# Patient Record
Sex: Male | Born: 1980 | Hispanic: No | Marital: Married | Smoking: Former smoker
Health system: Southern US, Community
[De-identification: ages and names within clinical notes are randomized; demographics above are authoritative.]

## PROBLEM LIST (undated history)

## (undated) DIAGNOSIS — U071 COVID-19: Secondary | ICD-10-CM

## (undated) DIAGNOSIS — K760 Fatty (change of) liver, not elsewhere classified: Secondary | ICD-10-CM

## (undated) DIAGNOSIS — B019 Varicella without complication: Secondary | ICD-10-CM

## (undated) DIAGNOSIS — R569 Unspecified convulsions: Secondary | ICD-10-CM

## (undated) DIAGNOSIS — M109 Gout, unspecified: Secondary | ICD-10-CM

## (undated) HISTORY — DX: Varicella without complication: B01.9

## (undated) HISTORY — DX: Gout, unspecified: M10.9

## (undated) HISTORY — DX: COVID-19: U07.1

## (undated) HISTORY — DX: Fatty (change of) liver, not elsewhere classified: K76.0

## (undated) HISTORY — DX: Unspecified convulsions: R56.9

## (undated) HISTORY — PX: FRACTURE SURGERY: SHX138

---

## 2016-06-12 ENCOUNTER — Ambulatory Visit (INDEPENDENT_AMBULATORY_CARE_PROVIDER_SITE_OTHER): Payer: 59 | Admitting: Family Medicine

## 2016-06-12 ENCOUNTER — Encounter: Payer: Self-pay | Admitting: Family Medicine

## 2016-06-12 VITALS — BP 132/92 | HR 102 | Temp 98.2°F | Ht 71.5 in | Wt 221.0 lb

## 2016-06-12 DIAGNOSIS — Z130 Encounter for screening for diseases of the blood and blood-forming organs and certain disorders involving the immune mechanism: Secondary | ICD-10-CM

## 2016-06-12 DIAGNOSIS — E669 Obesity, unspecified: Secondary | ICD-10-CM | POA: Diagnosis not present

## 2016-06-12 DIAGNOSIS — Z0000 Encounter for general adult medical examination without abnormal findings: Secondary | ICD-10-CM | POA: Diagnosis not present

## 2016-06-12 DIAGNOSIS — E66811 Obesity, class 1: Secondary | ICD-10-CM

## 2016-06-12 NOTE — Progress Notes (Signed)
Subjective:  Patient ID: James Andrews, male    DOB: [birth year]  Age: 35 y.o. MRN: [id number]  CC: Physical/establisih care.   HPI James Andrews is a 35 y.o. male presents to the clinic today for an annual physical.  Preventative Healthcare  Immunizations  Tetanus - 2013.  Flu - Declines.  Labs: Screening labs today.  Exercise: No regular exercise.  Alcohol use: No.  Smoking/tobacco use: Nonsmoker. Use smokeless tobacco.  PMH, Surgical Hx, Family Hx, Social History reviewed and updated as below.  Past Medical History:  Diagnosis Date  . Chicken pox   . Seizures ([place])    Absence seizure as a child.   Past Surgical History:  Procedure Laterality Date  . FRACTURE SURGERY     Hand fracture    Family History  Problem Relation Age of Onset  . Testicular cancer Brother   . Arthritis Maternal Grandmother   . Breast cancer Maternal Grandmother   . Arthritis Paternal Grandfather   . Diabetes Paternal Grandfather    Social History  Substance Use Topics  . Smoking status: Never Smoker  . Smokeless tobacco: Current User    Types: Chew  . Alcohol use No   Review of Systems General: Denies unexplained weight loss, fever. Skin: Denies new or changing mole, sore/wound that won't heal. ENT: Trouble hearing, ringing in the ears, sores in the mouth, hoarseness, trouble swallowing. Eyes: Denies trouble seeing/visual disturbance. Heart/CV: Denies chest pain, shortness of breath, edema, palpitations. Lungs/Resp: Denies cough, shortness of breath, hemoptysis. Abd/GI: Denies nausea, vomiting, diarrhea, constipation, abdominal pain, hematochezia, melena. GU: Denies dysuria, incontinence, hematuria, urinary frequency, difficulty starting/keeping stream, penile discharge, sexual difficulty, lump in testicles. MSK: Denies joint pain/swelling, myalgias. Neuro: Denies headaches, weakness, numbness, dizziness, syncope. Psych: Denies sadness, anxiety, stress, memory difficulty. Endocrine:  Denies polyuria and polydipsia.  Objective:   Today's Vitals: BP (!) 132/92 (BP Location: Right Arm, Patient Position: Sitting, Cuff Size: Large)   Pulse (!) 102   Temp 98.2 F (36.8 C) (Oral)   Ht 5' 11.5" (1.816 m)   Wt 221 lb (100.2 kg)   SpO2 96%   BMI 30.39 kg/m   Physical Exam  Constitutional: He is oriented to person, place, and time. He appears well-developed and well-nourished. No distress.  HENT:  Head: Normocephalic and atraumatic.  Nose: Nose normal.  Mouth/Throat: Oropharynx is clear and moist. No oropharyngeal exudate.  Normal TM's bilaterally.   Eyes: Conjunctivae are normal. No scleral icterus.  Neck: Neck supple. No thyromegaly present.  Cardiovascular: Normal rate and regular rhythm.   No murmur heard. Pulmonary/Chest: Effort normal and breath sounds normal. He has no wheezes. He has no rales.  Abdominal: Soft. He exhibits no distension. There is no tenderness. There is no rebound and no guarding. Hernia confirmed negative in the right inguinal area and confirmed negative in the left inguinal area.  Genitourinary: Testes normal and penis normal.  Musculoskeletal: Normal range of motion. He exhibits no edema.  Lymphadenopathy:    He has no cervical adenopathy.  Neurological: He is alert and oriented to person, place, and time.  Skin: Skin is warm and dry. No rash noted.  Psychiatric: He has a normal mood and affect.  Vitals reviewed.   Assessment & Plan:   Problem List Items Addressed This Visit    Obesity (BMI 30.0-34.9)   Relevant Orders   Comp Met (CMET)   Lipid Profile   HgB A1c   Annual physical exam - Primary    Tdap up to  date. Declines flu. Screening labs today.        Other Visit Diagnoses    Screening for deficiency anemia       Relevant Orders   CBC     Follow-up: Annually.  [place]

## 2016-06-12 NOTE — Patient Instructions (Signed)
Follow up annually.  Take care  Dr Lacinda Axon    Health Maintenance, Male A healthy lifestyle and preventative care can promote health and wellness.  Maintain regular health, dental, and eye exams.  Eat a healthy diet. Foods like vegetables, fruits, whole grains, low-fat dairy products, and lean protein foods contain the nutrients you need and are low in calories. Decrease your intake of foods high in solid fats, added sugars, and salt. Get information about a proper diet from your health care provider, if necessary.  Regular physical exercise is one of the most important things you can do for your health. Most adults should get at least 150 minutes of moderate-intensity exercise (any activity that increases your heart rate and causes you to sweat) each week. In addition, most adults need muscle-strengthening exercises on 2 or more days a week.   Maintain a healthy weight. The body mass index (BMI) is a screening tool to identify possible weight problems. It provides an estimate of body fat based on height and weight. Your health care provider can find your BMI and can help you achieve or maintain a healthy weight. For males 20 years and older:  A BMI below 18.5 is considered underweight.  A BMI of 18.5 to 24.9 is normal.  A BMI of 25 to 29.9 is considered overweight.  A BMI of 30 and above is considered obese.  Maintain normal blood lipids and cholesterol by exercising and minimizing your intake of saturated fat. Eat a balanced diet with plenty of fruits and vegetables. Blood tests for lipids and cholesterol should begin at age 16 and be repeated every 5 years. If your lipid or cholesterol levels are high, you are over age 63, or you are at high risk for heart disease, you may need your cholesterol levels checked more frequently.Ongoing high lipid and cholesterol levels should be treated with medicines if diet and exercise are not working.  If you smoke, find out from your health care  provider how to quit. If you do not use tobacco, do not start.  Lung cancer screening is recommended for adults aged 28-80 years who are at high risk for developing lung cancer because of a history of smoking. A yearly low-dose CT scan of the lungs is recommended for people who have at least a 30-pack-year history of smoking and are current smokers or have quit within the past 15 years. A pack year of smoking is smoking an average of 1 pack of cigarettes a day for 1 year (for example, a 30-pack-year history of smoking could mean smoking 1 pack a day for 30 years or 2 packs a day for 15 years). Yearly screening should continue until the smoker has stopped smoking for at least 15 years. Yearly screening should be stopped for people who develop a health problem that would prevent them from having lung cancer treatment.  If you choose to drink alcohol, do not have more than 2 drinks per day. One drink is considered to be 12 oz (360 mL) of beer, 5 oz (150 mL) of wine, or 1.5 oz (45 mL) of liquor.  Avoid the use of street drugs. Do not share needles with anyone. Ask for help if you need support or instructions about stopping the use of drugs.  High blood pressure causes heart disease and increases the risk of stroke. High blood pressure is more likely to develop in:  People who have blood pressure in the end of the normal range (100-139/85-89 mm Hg).  People  who are overweight or obese.  People who are African American.  If you are 46-77 years of age, have your blood pressure checked every 3-5 years. If you are 3 years of age or older, have your blood pressure checked every year. You should have your blood pressure measured twice--once when you are at a hospital or clinic, and once when you are not at a hospital or clinic. Record the average of the two measurements. To check your blood pressure when you are not at a hospital or clinic, you can use:  An automated blood pressure machine at a  pharmacy.  A home blood pressure monitor.  If you are 55-61 years old, ask your health care provider if you should take aspirin to prevent heart disease.  Diabetes screening involves taking a blood sample to check your fasting blood sugar level. This should be done once every 3 years after age 27 if you are at a normal weight and without risk factors for diabetes. Testing should be considered at a younger age or be carried out more frequently if you are overweight and have at least 1 risk factor for diabetes.  Colorectal cancer can be detected and often prevented. Most routine colorectal cancer screening begins at the age of 49 and continues through age 70. However, your health care provider may recommend screening at an earlier age if you have risk factors for colon cancer. On a yearly basis, your health care provider may provide home test kits to check for hidden blood in the stool. A small camera at the end of a tube may be used to directly examine the colon (sigmoidoscopy or colonoscopy) to detect the earliest forms of colorectal cancer. Talk to your health care provider about this at age 67 when routine screening begins. A direct exam of the colon should be repeated every 5-10 years through age 40, unless early forms of precancerous polyps or small growths are found.  People who are at an increased risk for hepatitis B should be screened for this virus. You are considered at high risk for hepatitis B if:  You were born in a country where hepatitis B occurs often. Talk with your health care provider about which countries are considered high risk.  Your parents were born in a high-risk country and you have not received a shot to protect against hepatitis B (hepatitis B vaccine).  You have HIV or AIDS.  You use needles to inject street drugs.  You live with, or have sex with, someone who has hepatitis B.  You are a man who has sex with other men (MSM).  You get hemodialysis  treatment.  You take certain medicines for conditions like cancer, organ transplantation, and autoimmune conditions.  Hepatitis C blood testing is recommended for all people born from [birth year] through 1966 and any individual with known risk factors for hepatitis C.  Healthy men should no longer receive prostate-specific antigen (PSA) blood tests as part of routine cancer screening. Talk to your health care provider about prostate cancer screening.  Testicular cancer screening is not recommended for adolescents or adult males who have no symptoms. Screening includes self-exam, a health care provider exam, and other screening tests. Consult with your health care provider about any symptoms you have or any concerns you have about testicular cancer.  Practice safe sex. Use condoms and avoid high-risk sexual practices to reduce the spread of sexually transmitted infections (STIs).  You should be screened for STIs, including gonorrhea and chlamydia if:  You are sexually active and are younger than 24 years.  You are older than 24 years, and your health care provider tells you that you are at risk for this type of infection.  Your sexual activity has changed since you were last screened, and you are at an increased risk for chlamydia or gonorrhea. Ask your health care provider if you are at risk.  If you are at risk of being infected with HIV, it is recommended that you take a prescription medicine daily to prevent HIV infection. This is called pre-exposure prophylaxis (PrEP). You are considered at risk if:  You are a man who has sex with other men (MSM).  You are a heterosexual man who is sexually active with multiple partners.  You take drugs by injection.  You are sexually active with a partner who has HIV.  Talk with your health care provider about whether you are at high risk of being infected with HIV. If you choose to begin PrEP, you should first be tested for HIV. You should then be tested  every 3 months for as long as you are taking PrEP.  Use sunscreen. Apply sunscreen liberally and repeatedly throughout the day. You should seek shade when your shadow is shorter than you. Protect yourself by wearing long sleeves, pants, a wide-brimmed hat, and sunglasses year round whenever you are outdoors.  Tell your health care provider of new moles or changes in moles, especially if there is a change in shape or color. Also, tell your health care provider if a mole is larger than the size of a pencil eraser.  A one-time screening for abdominal aortic aneurysm (AAA) and surgical repair of large AAAs by ultrasound is recommended for men aged 44-75 years who are current or former smokers.  Stay current with your vaccines (immunizations).   This information is not intended to replace advice given to you by your health care provider. Make sure you discuss any questions you have with your health care provider.   Document Released: 03/13/2008 Document Revised: 10/06/2014 Document Reviewed: 02/10/2011 Elsevier Interactive Patient Education [employer].

## 2016-06-12 NOTE — Progress Notes (Signed)
Pre visit review using our clinic review tool, if applicable. No additional management support is needed unless otherwise documented below in the visit note. 

## 2016-06-12 NOTE — Assessment & Plan Note (Signed)
Tdap up to date. Declines flu. Screening labs today.

## 2016-06-13 LAB — COMPREHENSIVE METABOLIC PANEL
ALK PHOS: 80 U/L (ref 39–117)
ALT: 40 U/L (ref 0–53)
AST: 24 U/L (ref 0–37)
Albumin: 4.3 g/dL (ref 3.5–5.2)
BILIRUBIN TOTAL: 0.6 mg/dL (ref 0.2–1.2)
BUN: 15 mg/dL (ref 6–23)
CO2: 31 meq/L (ref 19–32)
Calcium: 9.2 mg/dL (ref 8.4–10.5)
Chloride: 103 mEq/L (ref 96–112)
Creatinine, Ser: 1.15 mg/dL (ref 0.40–1.50)
GFR: 76.54 mL/min (ref >60.00)
GLUCOSE: 86 mg/dL (ref 70–99)
Potassium: 3.8 mEq/L (ref 3.5–5.1)
SODIUM: 140 meq/L (ref 135–145)
TOTAL PROTEIN: 7.6 g/dL (ref 6.0–8.3)

## 2016-06-13 LAB — HEMOGLOBIN A1C: Hgb A1c MFr Bld: 5.9 % (ref 4.6–6.5)

## 2016-06-13 LAB — LIPID PANEL
CHOL/HDL RATIO: 4
Cholesterol: 178 mg/dL (ref 0–200)
HDL: 44 mg/dL (ref >39.00)
LDL Cholesterol: 120 mg/dL — ABNORMAL HIGH (ref 0–99)
NonHDL: 133.66
Triglycerides: 69 mg/dL (ref 0.0–149.0)
VLDL: 13.8 mg/dL (ref 0.0–40.0)

## 2016-06-13 LAB — CBC
HCT: 43.3 % (ref 39.0–52.0)
HEMOGLOBIN: 14.5 g/dL (ref 13.0–17.0)
MCHC: 33.5 g/dL (ref 30.0–36.0)
MCV: 88.2 fl (ref 78.0–100.0)
Platelets: 200 10*3/uL (ref 150.0–400.0)
RBC: 4.9 Mil/uL (ref 4.22–5.81)
RDW: 14.9 % (ref 11.5–15.5)
WBC: 7.2 10*3/uL (ref 4.0–10.5)

## 2016-12-22 DIAGNOSIS — M79675 Pain in left toe(s): Secondary | ICD-10-CM | POA: Diagnosis not present

## 2016-12-22 DIAGNOSIS — L02412 Cutaneous abscess of left axilla: Secondary | ICD-10-CM | POA: Diagnosis not present

## 2017-04-16 ENCOUNTER — Encounter: Payer: Self-pay | Admitting: Family Medicine

## 2017-04-16 ENCOUNTER — Ambulatory Visit (INDEPENDENT_AMBULATORY_CARE_PROVIDER_SITE_OTHER): Payer: 59 | Admitting: Family Medicine

## 2017-04-16 DIAGNOSIS — M549 Dorsalgia, unspecified: Secondary | ICD-10-CM | POA: Insufficient documentation

## 2017-04-16 DIAGNOSIS — L918 Other hypertrophic disorders of the skin: Secondary | ICD-10-CM | POA: Insufficient documentation

## 2017-04-16 DIAGNOSIS — M545 Low back pain, unspecified: Secondary | ICD-10-CM

## 2017-04-16 DIAGNOSIS — R0981 Nasal congestion: Secondary | ICD-10-CM | POA: Diagnosis not present

## 2017-04-16 DIAGNOSIS — L729 Follicular cyst of the skin and subcutaneous tissue, unspecified: Secondary | ICD-10-CM | POA: Diagnosis not present

## 2017-04-16 MED ORDER — MUPIROCIN 2 % EX OINT: TOPICAL_OINTMENT | CUTANEOUS | 0 refills | Status: DC

## 2017-04-16 MED ORDER — INDOMETHACIN 50 MG PO CAPS: ORAL_CAPSULE | ORAL | 0 refills | Status: DC

## 2017-04-16 MED ORDER — BACLOFEN 10 MG PO TABS: 10.0000 mg | ORAL_TABLET | Freq: Three times a day (TID) | ORAL | 0 refills | Status: DC

## 2017-04-16 MED ORDER — FLUTICASONE PROPIONATE 50 MCG/ACT NA SUSP: 2.0000 | Freq: Every day | NASAL | 2 refills | Status: DC

## 2017-04-16 NOTE — Assessment & Plan Note (Addendum)
New problem. Removed today. See procedure.  Procedure: Skin tag removal Skin tags in the left and right axilla (4) were located. Areas cleaned with betadine. Anesthesia: Lidocaine 1% with epinephrine (2 mL total). Hemostat used to clamp the base of the skin tag. Skin tags subsequent removed with sterile scissors. Minimal bleeding; controlled with cautery.

## 2017-04-16 NOTE — Progress Notes (Signed)
Subjective:  Patient ID: Charleston Hankin, male    DOB: 07-Oct-1980  Age: 36 y.o. MRN: [id number]  CC: Cyst, Skin tags, Back pain, Sinus issues  HPI:  36 year old male presents with the above complaints.  Skin cyst  Has been present for years. Location: Posterior shoulder (left).  Has been slowly enlarging.  Bothersome at times. No current redness or tenderness to suggest infection.  He has had this removed previously but it recurred.  He is interested in removal today.  No reports of drainage from the cyst. Currently nontender.  Skin tags  Patient reports he has had several skin tags in his axilla (bilateral) for quite some time.  They are starting to bother him as they get irritated and become bothersome while bathing in the shower.  No reports of erythema. No tenderness.  He wants them removed.  He has one in the left axilla and 3 in the right axilla.  Back pain  Patient reports he has intermittent back pain.  He feels that it's muscular in origin.  He states that his low back tightens up.  He states that his wife has some baclofen at home. He is interested in having a muscle relaxer as he think it would help.  Moderate in severity at times.  Likely from heavy lifting at work.  No other associated symptoms.  Sinus issues  Patient has had a history of sinusitis.  He states that intranasal steroids have helped stable off future bouts of sinusitis.  He states that he was on Flonase previously. Next line he has none at this time.  He would like a prescription.   Social Hx   Social History   Social History  . Marital status: Married    Spouse name: N/A  . Number of children: N/A  . Years of education: N/A   Social History Main Topics  . Smoking status: Never Smoker  . Smokeless tobacco: Current User    Types: Chew  . Alcohol use No  . Drug use: No  . Sexual activity: Yes    Partners: Female   Other Topics Concern  . None   Social History  Narrative  . None    Review of Systems  HENT: Positive for congestion.   Musculoskeletal: Positive for back pain.  Skin:       Cyst, skin tags.   Objective:  BP 118/86   Pulse 90   Temp 97.7 F (36.5 C) (Oral)   Wt 226 lb (102.5 kg)   SpO2 96%   BMI 31.08 kg/m   BP/Weight 04/16/2017 0/93/2671  Systolic BP 245 809  Diastolic BP 86 92  Wt. (Lbs) 226 221  BMI 31.08 30.39    Physical Exam  Constitutional: He is oriented to person, place, and time. He appears well-developed. No distress.  HENT:  Head: Normocephalic and atraumatic.  Pulmonary/Chest: Effort normal. No respiratory distress.  Musculoskeletal:  Back - Normal ROM.  Neurological: He is alert and oriented to person, place, and time.  Skin:  Left axilla - one small skin tag noted. Right axilla - 3 small skin tags noted. Left posterior shoulder - 3 cm circumferential nodule noted. Nontender to palpation. No surrounding erythema. No fluctuance.  Psychiatric: He has a normal mood and affect.  Vitals reviewed.   Lab Results  Component Value Date   WBC 7.2 06/12/2016   HGB 14.5 06/12/2016   HCT 43.3 06/12/2016   PLT 200.0 06/12/2016   GLUCOSE 86 06/12/2016   CHOL 178 06/12/2016  TRIG 69.0 06/12/2016   HDL 44.00 06/12/2016   LDLCALC 120 (H) 06/12/2016   ALT 40 06/12/2016   AST 24 06/12/2016   NA 140 06/12/2016   K 3.8 06/12/2016   CL 103 06/12/2016   CREATININE 1.15 06/12/2016   BUN 15 06/12/2016   CO2 31 06/12/2016   HGBA1C 5.9 06/12/2016    Assessment & Plan:   Problem List Items Addressed This Visit      Respiratory   Sinus congestion    Rx for Flonase sent.        Musculoskeletal and Integument   Benign skin cyst    New problem. Removed today without difficulty. See procedure.  Procedure: Cyst removal  Area prepped and draped in sterile fashion.   Local anesthesia with 1% lidocaine with epi (3 mL).  #11 scalpel was used to make a linear incision.  Blunt dissection and pressure  was used to remove cyst contents and sac.  Wound was closed with 3 simple interrupted sutures.  Patient tolerated the procedure well. No complications. Minimal blood loss.       Skin tags, multiple acquired    New problem. Removed today. See procedure.  Procedure: Skin tag removal Skin tags in the left and right axilla (4) were located. Areas cleaned with betadine. Anesthesia: Lidocaine 1% with epinephrine (2 mL total). Hemostat used to clamp the base of the skin tag. Skin tags subsequent removed with sterile scissors. Minimal bleeding; controlled with cautery.         Other   Back pain    New problem. PRN Baclofen.      Relevant Medications   indomethacin (INDOCIN) 50 MG capsule   baclofen (LIORESAL) 10 MG tablet      Meds ordered this encounter  Medications  . DISCONTD: indomethacin (INDOCIN) 50 MG capsule    Sig: TAKE 1 CAP 3 TIMES DAILY WITH FOOD. MAY STOP WHEN PAIN RESOLVED    Refill:  1  . indomethacin (INDOCIN) 50 MG capsule    Sig: TAKE 1 CAP 3 TIMES DAILY WITH FOOD. MAY STOP WHEN PAIN RESOLVED    Dispense:  90 capsule    Refill:  0  . baclofen (LIORESAL) 10 MG tablet    Sig: Take 1 tablet (10 mg total) by mouth 3 (three) times daily.    Dispense:  30 each    Refill:  0  . fluticasone (FLONASE) 50 MCG/ACT nasal spray    Sig: Place 2 sprays into both nostrils daily.    Dispense:  16 g    Refill:  2  . mupirocin ointment (BACTROBAN) 2 %    Sig: Apply topical to affected area daily.    Dispense:  22 g    Refill:  0   Follow-up: No Follow-up on file.  [place]

## 2017-04-16 NOTE — Assessment & Plan Note (Signed)
New problem. PRN Baclofen.

## 2017-04-16 NOTE — Assessment & Plan Note (Signed)
New problem. Removed today without difficulty. See procedure.  Procedure: Cyst removal  Area prepped and draped in sterile fashion.   Local anesthesia with 1% lidocaine with epi (3 mL).  #11 scalpel was used to make a linear incision.  Blunt dissection and pressure was used to remove cyst contents and sac.  Wound was closed with 3 simple interrupted sutures.  Patient tolerated the procedure well. No complications. Minimal blood loss.

## 2017-04-16 NOTE — Patient Instructions (Signed)
Follow up in 7 days for suture removal.  Take care  Dr. Lacinda Andrews    Sutured Wound Care Sutures are stitches that can be used to close wounds. Taking care of your wound properly can help to prevent pain and infection. It can also help your wound to heal more quickly. How is this treated? Wound Care  Keep the wound clean and dry.  If you were given a bandage (dressing), you should change it at least once per day or as directed by your health care provider. You should also change it if it becomes wet or dirty.  Keep the wound completely dry for the first 24 hours or as directed by your health care provider. After that time, you may shower or bathe. However, make sure that the wound is not soaked in water until the sutures have been removed.  Clean the wound one time each day or as directed by your health care provider. ? Wash the wound with soap and water. ? Rinse the wound with water to remove all soap. ? Pat the wound dry with a clean towel. Do not rub the wound.  Aftercleaning the wound, apply a thin layer of antibioticointment as directed by your health care provider. This will help to prevent infection and keep the dressing from sticking to the wound.  Have the sutures removed as directed by your health care provider. General Instructions  Take or apply medicines only as directed by your health care provider.  To help prevent scarring, make sure to cover your wound with sunscreen whenever you are outside after the sutures are removed and the wound is healed. Make sure to wear a sunscreen of at least 30 SPF.  If you were prescribed an antibiotic medicine or ointment, finish all of it even if you start to feel better.  Do not scratch or pick at the wound.  Keep all follow-up visits as directed by your health care provider. This is important.  Check your wound every day for signs of infection. Watch for: ? Redness, swelling, or pain. ? Fluid, blood, or pus.  Raise (elevate) the  injured area above the level of your heart while you are sitting or lying down, if possible.  Avoid stretching your wound.  Drink enough fluids to keep your urine clear or pale yellow. Contact a health care provider if:  You received a tetanus shot and you have swelling, severe pain, redness, or bleeding at the injection site.  You have a fever.  A wound that was closed breaks open.  You notice a bad smell coming from the wound.  You notice something coming out of the wound, such as wood or glass.  Your pain is not controlled with medicine.  You have increased redness, swelling, or pain at the site of your wound.  You have fluid, blood, or pus coming from your wound.  You notice a change in the color of your skin near your wound.  You need to change the dressing frequently due to fluid, blood, or pus draining from the wound.  You develop a new rash.  You develop numbness around the wound. Get help right away if:  You develop severe swelling around the injury site.  Your pain suddenly increases and is severe.  You develop painful lumps near the wound or on skin that is anywhere on your body.  You have a red streak going away from your wound.  The wound is on your hand or foot and you cannot properly  move a finger or toe.  The wound is on your hand or foot and you notice that your fingers or toes look pale or bluish. This information is not intended to replace advice given to you by your health care provider. Make sure you discuss any questions you have with your health care provider. Document Released: 10/23/2004 Document Revised: 02/21/2016 Document Reviewed: 04/27/2013 Elsevier Interactive Patient Education  2017 [employer].

## 2017-04-16 NOTE — Assessment & Plan Note (Signed)
Rx for Flonase sent.

## 2017-04-24 ENCOUNTER — Ambulatory Visit (INDEPENDENT_AMBULATORY_CARE_PROVIDER_SITE_OTHER): Payer: 59 | Admitting: Family Medicine

## 2017-04-24 ENCOUNTER — Encounter: Payer: Self-pay | Admitting: Family Medicine

## 2017-04-24 DIAGNOSIS — Z4802 Encounter for removal of sutures: Secondary | ICD-10-CM

## 2017-04-24 NOTE — Assessment & Plan Note (Signed)
Sutures removed today and standard fashion. Wound is well-healed.

## 2017-04-24 NOTE — Progress Notes (Signed)
   Subjective:  Patient ID: James Andrews, male    DOB: [birth year]  Age: 36 y.o. MRN: [id number]  CC: Suture removal  HPI:  36 year old male presents for suture removal.  Patient was recently seen on 7/19 and had a skin cyst removed. 3 simple interrupted sutures were placed. He states that the wound is healing well. No drainage from the wound. No erythema. He has no complaints or concerns at this time.  Social Hx   Social History   Social History  . Marital status: Married    Spouse name: N/A  . Number of children: N/A  . Years of education: N/A   Social History Main Topics  . Smoking status: Never Smoker  . Smokeless tobacco: Current User    Types: Chew  . Alcohol use No  . Drug use: No  . Sexual activity: Yes    Partners: Female   Other Topics Concern  . None   Social History Narrative  . None    Review of Systems  Constitutional: Negative.   Skin:       No erythema or drainage.   Objective:  BP 120/80 (BP Location: Left Arm, Patient Position: Sitting, Cuff Size: Large)   Pulse (!) 103   Temp 98.4 F (36.9 C) (Oral)   Wt 227 lb 2 oz (103 kg)   SpO2 95%   BMI 31.24 kg/m   BP/Weight 04/24/2017 04/16/2017 4/82/7078  Systolic BP 675 449 201  Diastolic BP 80 86 92  Wt. (Lbs) 227.13 226 221  BMI 31.24 31.08 30.39    Physical Exam  Constitutional: He appears well-developed. No distress.  Skin:  Left posterior shoulder - wound is well-healed. No erythema. No drainage.  Vitals reviewed.   Lab Results  Component Value Date   WBC 7.2 06/12/2016   HGB 14.5 06/12/2016   HCT 43.3 06/12/2016   PLT 200.0 06/12/2016   GLUCOSE 86 06/12/2016   CHOL 178 06/12/2016   TRIG 69.0 06/12/2016   HDL 44.00 06/12/2016   LDLCALC 120 (H) 06/12/2016   ALT 40 06/12/2016   AST 24 06/12/2016   NA 140 06/12/2016   K 3.8 06/12/2016   CL 103 06/12/2016   CREATININE 1.15 06/12/2016   BUN 15 06/12/2016   CO2 31 06/12/2016   HGBA1C 5.9 06/12/2016    Assessment & Plan:    Problem List Items Addressed This Visit      Other   Encounter for removal of sutures    Sutures removed today and standard fashion. Wound is well-healed.        Follow-up: PRN  [place]

## 2017-04-28 DIAGNOSIS — D3132 Benign neoplasm of left choroid: Secondary | ICD-10-CM | POA: Diagnosis not present

## 2017-04-28 DIAGNOSIS — D3131 Benign neoplasm of right choroid: Secondary | ICD-10-CM | POA: Diagnosis not present

## 2017-07-29 ENCOUNTER — Other Ambulatory Visit: Payer: Self-pay | Admitting: Family Medicine

## 2017-07-30 NOTE — Telephone Encounter (Signed)
Please find out what he takes this medication for. Thanks.

## 2017-07-30 NOTE — Telephone Encounter (Signed)
Last OV was 7/27, last refill was 7/19, #90, no follow up.  Please advise, thanks

## 2017-07-31 NOTE — Telephone Encounter (Signed)
Spoke with patient he takes medication for gout He will call back to schedule appointment with Dr Aundra Dubin with CPE

## 2017-08-27 ENCOUNTER — Other Ambulatory Visit: Payer: Self-pay

## 2017-08-27 MED ORDER — FLUTICASONE PROPIONATE 50 MCG/ACT NA SUSP: 2.0000 | Freq: Every day | NASAL | 0 refills | Status: DC

## 2017-08-27 MED ORDER — INDOMETHACIN 50 MG PO CAPS: ORAL_CAPSULE | ORAL | 0 refills | Status: DC

## 2017-08-27 NOTE — Telephone Encounter (Signed)
Last OV 04/24/17 with Dr.Cook patient is establishing care with Dr.Mcleanlast filled 08/01/17 90 0rf patient would like 90 day supply

## 2017-09-21 ENCOUNTER — Ambulatory Visit (INDEPENDENT_AMBULATORY_CARE_PROVIDER_SITE_OTHER): Payer: 59 | Admitting: Internal Medicine

## 2017-09-21 ENCOUNTER — Encounter: Payer: Self-pay | Admitting: Internal Medicine

## 2017-09-21 VITALS — BP 116/74 | HR 94 | Ht 71.75 in | Wt 223.4 lb

## 2017-09-21 DIAGNOSIS — Z0000 Encounter for general adult medical examination without abnormal findings: Secondary | ICD-10-CM

## 2017-09-21 DIAGNOSIS — M109 Gout, unspecified: Secondary | ICD-10-CM

## 2017-09-21 DIAGNOSIS — Z13220 Encounter for screening for lipoid disorders: Secondary | ICD-10-CM

## 2017-09-21 DIAGNOSIS — Z1329 Encounter for screening for other suspected endocrine disorder: Secondary | ICD-10-CM

## 2017-09-21 DIAGNOSIS — M79601 Pain in right arm: Secondary | ICD-10-CM | POA: Diagnosis not present

## 2017-09-21 DIAGNOSIS — R7303 Prediabetes: Secondary | ICD-10-CM

## 2017-09-21 DIAGNOSIS — Z1159 Encounter for screening for other viral diseases: Secondary | ICD-10-CM | POA: Diagnosis not present

## 2017-09-21 DIAGNOSIS — H9319 Tinnitus, unspecified ear: Secondary | ICD-10-CM | POA: Diagnosis not present

## 2017-09-21 LAB — LIPID PANEL
CHOL/HDL RATIO: 4
CHOLESTEROL: 167 mg/dL (ref 0–200)
HDL: 46.1 mg/dL (ref >39.00)
LDL CALC: 109 mg/dL — AB (ref 0–99)
NONHDL: 121.07
Triglycerides: 60 mg/dL (ref 0.0–149.0)
VLDL: 12 mg/dL (ref 0.0–40.0)

## 2017-09-21 LAB — URINALYSIS, ROUTINE W REFLEX MICROSCOPIC
Bilirubin Urine: NEGATIVE
Hgb urine dipstick: NEGATIVE
Ketones, ur: NEGATIVE
Leukocytes, UA: NEGATIVE
Nitrite: NEGATIVE
PH: 6 (ref 5.0–8.0)
Specific Gravity, Urine: 1.02 (ref 1.000–1.030)
TOTAL PROTEIN, URINE-UPE24: NEGATIVE
Urine Glucose: NEGATIVE
Urobilinogen, UA: 0.2 (ref 0.0–1.0)

## 2017-09-21 LAB — CBC WITH DIFFERENTIAL/PLATELET
BASOS PCT: 0.4 % (ref 0.0–3.0)
Basophils Absolute: 0 10*3/uL (ref 0.0–0.1)
EOS PCT: 3.3 % (ref 0.0–5.0)
Eosinophils Absolute: 0.1 10*3/uL (ref 0.0–0.7)
HEMATOCRIT: 45.9 % (ref 39.0–52.0)
HEMOGLOBIN: 14.9 g/dL (ref 13.0–17.0)
Lymphocytes Relative: 34.5 % (ref 12.0–46.0)
Lymphs Abs: 1.4 10*3/uL (ref 0.7–4.0)
MCHC: 32.5 g/dL (ref 30.0–36.0)
MCV: 93.4 fl (ref 78.0–100.0)
MONO ABS: 0.4 10*3/uL (ref 0.1–1.0)
MONOS PCT: 10.3 % (ref 3.0–12.0)
Neutro Abs: 2.1 10*3/uL (ref 1.4–7.7)
Neutrophils Relative %: 51.5 % (ref 43.0–77.0)
Platelets: 228 10*3/uL (ref 150.0–400.0)
RBC: 4.92 Mil/uL (ref 4.22–5.81)
RDW: 14.5 % (ref 11.5–15.5)
WBC: 4.1 10*3/uL (ref 4.0–10.5)

## 2017-09-21 LAB — COMPREHENSIVE METABOLIC PANEL
ALBUMIN: 4.5 g/dL (ref 3.5–5.2)
ALT: 43 U/L (ref 0–53)
AST: 27 U/L (ref 0–37)
Alkaline Phosphatase: 81 U/L (ref 39–117)
BUN: 15 mg/dL (ref 6–23)
CHLORIDE: 103 meq/L (ref 96–112)
CO2: 28 mEq/L (ref 19–32)
Calcium: 9.1 mg/dL (ref 8.4–10.5)
Creatinine, Ser: 1 mg/dL (ref 0.40–1.50)
GFR: 89.3 mL/min (ref >60.00)
Glucose, Bld: 101 mg/dL — ABNORMAL HIGH (ref 70–99)
POTASSIUM: 4.1 meq/L (ref 3.5–5.1)
SODIUM: 138 meq/L (ref 135–145)
Total Bilirubin: 0.5 mg/dL (ref 0.2–1.2)
Total Protein: 8 g/dL (ref 6.0–8.3)

## 2017-09-21 LAB — TSH: TSH: 0.65 u[IU]/mL (ref 0.35–4.50)

## 2017-09-21 LAB — T4, FREE: FREE T4: 0.67 ng/dL (ref 0.60–1.60)

## 2017-09-21 LAB — HEMOGLOBIN A1C: Hgb A1c MFr Bld: 6 % (ref 4.6–6.5)

## 2017-09-21 LAB — URIC ACID: URIC ACID, SERUM: 11 mg/dL — AB (ref 4.0–7.8)

## 2017-09-21 NOTE — Progress Notes (Signed)
Chief Complaint  Patient presents with  . Annual Exam    gout   Annual physical  1. He has gout flares in lower ext esp after eating fries which he loves 2. C/o ringing in ears w/o trouble hearing  3. C/o gout in left and right feet 4. C/o right biceps pain x 2 months with heavy lifting and lifts 400s lbs 2/10 no meds tried. No elbow pain    Review of Systems  Constitutional: Negative for weight loss.  HENT: Positive for tinnitus. Negative for hearing loss.   Eyes:       No vision changes   Respiratory: Negative for shortness of breath.   Cardiovascular: Negative for chest pain.  Gastrointestinal: Negative for abdominal pain and blood in stool.  Musculoskeletal: Positive for joint pain.  Skin:       +moles  Neurological: Negative for headaches.  Psychiatric/Behavioral: Negative for memory loss.   Past Medical History:  Diagnosis Date  . Chicken pox   . Gout   . Seizures ([place])    Absence seizure as a child.   Past Surgical History:  Procedure Laterality Date  . FRACTURE SURGERY     Hand fracture   Family History  Problem Relation Age of Onset  . Testicular cancer Brother        dx age 36  . Arthritis Maternal Grandmother   . Breast cancer Maternal Grandmother   . Arthritis Paternal Grandfather   . Diabetes Paternal Grandfather    Social History   Socioeconomic History  . Marital status: Married    Spouse name: Not on file  . Number of children: Not on file  . Years of education: Not on file  . Highest education level: Not on file  Social Needs  . Financial resource strain: Not on file  . Food insecurity - worry: Not on file  . Food insecurity - inability: Not on file  . Transportation needs - medical: Not on file  . Transportation needs - non-medical: Not on file  Occupational History  . Not on file  Tobacco Use  . Smoking status: Former Smoker    Start date: 09/22/2007  . Smokeless tobacco: Former Systems developer    Types: [place] date: 06/25/2017   Substance and Sexual Activity  . Alcohol use: No  . Drug use: No  . Sexual activity: Yes    Partners: Female  Other Topics Concern  . Not on file  Social History Narrative   Works with hazardous waste    Current Meds  Medication Sig  . baclofen (LIORESAL) 10 MG tablet Take 1 tablet (10 mg total) by mouth 3 (three) times daily.  . fluticasone (FLONASE) 50 MCG/ACT nasal spray Place 2 sprays into both nostrils daily.  . indomethacin (INDOCIN) 50 MG capsule TAKE ONE CAPSULE BY MOUTH 3 TIMES A DAY WITH FOOD **MAY STOP WHEN PAIN RESOLVED**  . mupirocin ointment (BACTROBAN) 2 % Apply topical to affected area daily.   No Known Allergies No results found for this or any previous visit (from the past 2160 hour(s)). Objective  Body mass index is 30.51 kg/m. Wt Readings from Last 3 Encounters:  09/21/17 223 lb 6 oz (101.3 kg)  04/24/17 227 lb 2 oz (103 kg)  04/16/17 226 lb (102.5 kg)   Temp Readings from Last 3 Encounters:  04/24/17 98.4 F (36.9 C) (Oral)  04/16/17 97.7 F (36.5 C) (Oral)  06/12/16 98.2 F (36.8 C) (Oral)   BP Readings from Last  3 Encounters:  09/21/17 116/74  04/24/17 120/80  04/16/17 118/86   Pulse Readings from Last 3 Encounters:  09/21/17 94  04/24/17 (!) 103  04/16/17 90  O2 sat room air 96%   Physical Exam  Constitutional: He is oriented to person, place, and time and well-developed, well-nourished, and in no distress. Vital signs are normal.  HENT:  Head: Normocephalic and atraumatic.  Mouth/Throat: Oropharynx is clear and moist and mucous membranes are normal.  Fluid in b/l ears and mild cerumen   Eyes: Conjunctivae are normal. Pupils are equal, round, and reactive to light.  Cardiovascular: Normal rate, regular rhythm and normal heart sounds.  Neg leg edema b/l   Pulmonary/Chest: Effort normal and breath sounds normal.  Abdominal: Soft. Bowel sounds are normal. There is no tenderness.  Neurological: He is alert and oriented to person, place,  and time. Gait normal.  Skin: Skin is warm and dry.  lentigos and nevi to trunk benign appearing   Psychiatric: Mood, memory, affect and judgment normal.  Nursing note and vitals reviewed.   Assessment   1. Gout 2. tinnitis  3. Right lower biceps pain  4.HM/Wellness  Plan  1.   Check labs CMET, CBC, lipid, TSH, T4, UA, hep B, A1C, uric acid declines STD check  Disc allopurinol pt wants to hold for now  Prn Indomethicin   2. rec use Zyrtec or claritin with flonase  Ear plugs with loud noises at work  3. Let me know if getting worse  4.  Congratulated on quitting smoking and chew  -former smoker quit 10 years ago smoked x 10 years 1 ppd no FH lung cancer  Declines flu shot  Tdap had 11/2011  Declines STD check been with wife x years married since 2013    Provider: Dr. Olivia Mackie McLean-Scocuzza-Internal Medicine

## 2017-09-21 NOTE — Patient Instructions (Addendum)
Follow up in 6-12 months sooner if needed  Happy Holidays    Gout Gout is painful swelling that can occur in some of your joints. Gout is a type of arthritis. This condition is caused by having too much uric acid in your body. Uric acid is a chemical that forms when your body breaks down substances called purines. Purines are important for building body proteins. When your body has too much uric acid, sharp crystals can form and build up inside your joints. This causes pain and swelling. Gout attacks can happen quickly and be very painful (acute gout). Over time, the attacks can affect more joints and become more frequent (chronic gout). Gout can also cause uric acid to build up under your skin and inside your kidneys. What are the causes? This condition is caused by too much uric acid in your blood. This can occur because:  Your kidneys do not remove enough uric acid from your blood. This is the most common cause.  Your body makes too much uric acid. This can occur with some cancers and cancer treatments. It can also occur if your body is breaking down too many red blood cells (hemolytic anemia).  You eat too many foods that are high in purines. These foods include organ meats and some seafood. Alcohol, especially beer, is also high in purines.  A gout attack may be triggered by trauma or stress. What increases the risk? This condition is more likely to develop in people who:  Have a family history of gout.  Are male and middle-aged.  Are male and have gone through menopause.  Are obese.  Frequently drink alcohol, especially beer.  Are dehydrated.  Lose weight too quickly.  Have an organ transplant.  Have lead poisoning.  Take certain medicines, including aspirin, cyclosporine, diuretics, levodopa, and niacin.  Have kidney disease or psoriasis.  What are the signs or symptoms? An attack of acute gout happens quickly. It usually occurs in just one joint. The most common  place is the big toe. Attacks often start at night. Other joints that may be affected include joints of the feet, ankle, knee, fingers, wrist, or elbow. Symptoms may include:  Severe pain.  Warmth.  Swelling.  Stiffness.  Tenderness. The affected joint may be very painful to touch.  Shiny, red, or purple skin.  Chills and fever.  Chronic gout may cause symptoms more frequently. More joints may be involved. You may also have white or yellow lumps (tophi) on your hands or feet or in other areas near your joints. How is this diagnosed? This condition is diagnosed based on your symptoms, medical history, and physical exam. You may have tests, such as:  Blood tests to measure uric acid levels.  Removal of joint fluid with a needle (aspiration) to look for uric acid crystals.  X-rays to look for joint damage.  How is this treated? Treatment for this condition has two phases: treating an acute attack and preventing future attacks. Acute gout treatment may include medicines to reduce pain and swelling, including:  NSAIDs.  Steroids. These are strong anti-inflammatory medicines that can be taken by mouth (orally) or injected into a joint.  Colchicine. This medicine relieves pain and swelling when it is taken soon after an attack. It can be given orally or through an IV tube.  Preventive treatment may include:  Daily use of smaller doses of NSAIDs or colchicine.  Use of a medicine that reduces uric acid levels in your blood.  Changes  to your diet. You may need to see a specialist about healthy eating (dietitian).  Follow these instructions at home: During a Gout Attack  If directed, apply ice to the affected area: ? Put ice in a plastic bag. ? Place a towel between your skin and the bag. ? Leave the ice on for 20 minutes, 2-3 times a day.  Rest the joint as much as possible. If the affected joint is in your leg, you may be given crutches to use.  Raise (elevate) the  affected joint above the level of your heart as often as possible.  Drink enough fluids to keep your urine clear or pale yellow.  Take over-the-counter and prescription medicines only as told by your health care provider.  Do not drive or operate heavy machinery while taking prescription pain medicine.  Follow instructions from your health care provider about eating or drinking restrictions.  Return to your normal activities as told by your health care provider. Ask your health care provider what activities are safe for you. Avoiding Future Gout Attacks  Follow a low-purine diet as told by your dietitian or health care provider. Avoid foods and drinks that are high in purines, including liver, kidney, anchovies, asparagus, herring, mushrooms, mussels, and beer.  Limit alcohol intake to no more than 1 drink a day for nonpregnant women and 2 drinks a day for men. One drink equals 12 oz of beer, 5 oz of wine, or 1 oz of hard liquor.  Maintain a healthy weight or lose weight if you are overweight. If you want to lose weight, talk with your health care provider. It is important that you do not lose weight too quickly.  Start or maintain an exercise program as told by your health care provider.  Drink enough fluids to keep your urine clear or pale yellow.  Take over-the-counter and prescription medicines only as told by your health care provider.  Keep all follow-up visits as told by your health care provider. This is important. Contact a health care provider if:  You have another gout attack.  You continue to have symptoms of a gout attack after10 days of treatment.  You have side effects from your medicines.  You have chills or a fever.  You have burning pain when you urinate.  You have pain in your lower back or belly. Get help right away if:  You have severe or uncontrolled pain.  You cannot urinate. This information is not intended to replace advice given to you by your  health care provider. Make sure you discuss any questions you have with your health care provider. Document Released: 09/12/2000 Document Revised: 02/21/2016 Document Reviewed: 06/28/2015 Elsevier Interactive Patient Education  2018 [place] are compounds that affect the level of uric acid in your body. A low-purine diet is a diet that is low in purines. Eating a low-purine diet can prevent the level of uric acid in your body from getting too high and causing gout or kidney stones or both. What do I need to know about this diet?  Choose low-purine foods. Examples of low-purine foods are listed in the next section.  Drink plenty of fluids, especially water. Fluids can help remove uric acid from your body. Try to drink 8-16 cups (1.9-3.8 L) a day.  Limit foods high in fat, especially saturated fat, as fat makes it harder for the body to get rid of uric acid. Foods high in saturated fat include pizza, cheese, ice cream,  whole milk, fried foods, and gravies. Choose foods that are lower in fat and lean sources of protein. Use olive oil when cooking as it contains healthy fats that are not high in saturated fat.  Limit alcohol. Alcohol interferes with the elimination of uric acid from your body. If you are having a gout attack, avoid all alcohol.  Keep in mind that different people's bodies react differently to different foods. You will probably learn over time which foods do or do not affect you. If you discover that a food tends to cause your gout to flare up, avoid eating that food. You can more freely enjoy foods that do not cause problems. If you have any questions about a food item, talk to your dietitian or health care provider. Which foods are low, moderate, and high in purines? The following is a list of foods that are low, moderate, and high in purines. You can eat any amount of the foods that are low in purines. You may be able to have small amounts of foods that  are moderate in purines. Ask your health care provider how much of a food moderate in purines you can have. Avoid foods high in purines. Grains  Foods low in purines: Enriched white bread, pasta, rice, cake, cornbread, popcorn.  Foods moderate in purines: Whole-grain breads and cereals, wheat germ, bran, oatmeal. Uncooked oatmeal. Dry wheat bran or wheat germ.  Foods high in purines: Pancakes, [place] toast, biscuits, muffins. Vegetables  Foods low in purines: All vegetables, except those that are moderate in purines.  Foods moderate in purines: Asparagus, cauliflower, spinach, mushrooms, green peas. Fruits  All fruits are low in purines. Meats and other Protein Foods  Foods low in purines: Eggs, nuts, peanut butter.  Foods moderate in purines: 80-90% lean beef, lamb, veal, pork, poultry, fish, eggs, peanut butter, nuts. Crab, lobster, oysters, and shrimp. Cooked dried beans, peas, and lentils.  Foods high in purines: Anchovies, sardines, herring, mussels, tuna, codfish, scallops, trout, and haddock. [place] Salines. Organ meats (such as liver or kidney). [place]. Game meat. Goose. Sweetbreads. Dairy  All dairy foods are low in purines. Low-fat and fat-free dairy products are best because they are low in saturated fat. Beverages  Drinks low in purines: Water, carbonated beverages, tea, coffee, cocoa.  Drinks moderate in purines: Soft drinks and other drinks sweetened with high-fructose corn syrup. Juices. To find whether a food or drink is sweetened with high-fructose corn syrup, look at the ingredients list.  Drinks high in purines: Alcoholic beverages (such as beer). Condiments  Foods low in purines: Salt, herbs, olives, pickles, relishes, vinegar.  Foods moderate in purines: Butter, margarine, oils, mayonnaise. Fats and Oils  Foods low in purines: All types, except gravies and sauces made with meat.  Foods high in purines: Gravies and sauces made with meat. Other Foods  Foods low  in purines: Sugars, sweets, gelatin. Cake. Soups made without meat.  Foods moderate in purines: Meat-based or fish-based soups, broths, or bouillons. Foods and drinks sweetened with high-fructose corn syrup.  Foods high in purines: High-fat desserts (such as ice cream, cookies, cakes, pies, doughnuts, and chocolate). Contact your dietitian for more information on foods that are not listed here. This information is not intended to replace advice given to you by your health care provider. Make sure you discuss any questions you have with your health care provider. Document Released: 01/10/2011 Document Revised: 02/21/2016 Document Reviewed: 08/22/2013 Elsevier Interactive Patient Education  2017 [employer].

## 2017-09-22 LAB — HEPATITIS B SURFACE ANTIGEN: Hepatitis B Surface Ag: NONREACTIVE

## 2017-09-22 LAB — HEPATITIS B SURFACE ANTIBODY, QUANTITATIVE

## 2017-09-22 LAB — HEPATITIS B CORE ANTIBODY, TOTAL: HEP B C TOTAL AB: NONREACTIVE

## 2017-10-08 ENCOUNTER — Ambulatory Visit (INDEPENDENT_AMBULATORY_CARE_PROVIDER_SITE_OTHER): Payer: 59

## 2017-10-08 DIAGNOSIS — Z23 Encounter for immunization: Secondary | ICD-10-CM

## 2017-10-08 NOTE — Progress Notes (Signed)
Patient comes in today for Hepatitis B injection. Administered in Left deltoid IM. Patient tolerated well. Patient informed to return in 2 months for next dose.

## 2017-12-08 ENCOUNTER — Ambulatory Visit: Payer: 59

## 2017-12-16 ENCOUNTER — Ambulatory Visit (INDEPENDENT_AMBULATORY_CARE_PROVIDER_SITE_OTHER): Payer: 59 | Admitting: *Deleted

## 2017-12-16 DIAGNOSIS — Z23 Encounter for immunization: Secondary | ICD-10-CM

## 2017-12-16 NOTE — Progress Notes (Signed)
Patient tolerated vaccine well 

## 2018-01-01 ENCOUNTER — Other Ambulatory Visit: Payer: Self-pay | Admitting: Internal Medicine

## 2018-01-01 DIAGNOSIS — M1A9XX0 Chronic gout, unspecified, without tophus (tophi): Secondary | ICD-10-CM

## 2018-01-01 MED ORDER — INDOMETHACIN 50 MG PO CAPS: ORAL_CAPSULE | ORAL | 0 refills | Status: DC

## 2018-04-06 ENCOUNTER — Ambulatory Visit (INDEPENDENT_AMBULATORY_CARE_PROVIDER_SITE_OTHER): Payer: 59

## 2018-04-06 DIAGNOSIS — Z23 Encounter for immunization: Secondary | ICD-10-CM

## 2018-04-06 NOTE — Progress Notes (Signed)
Patient comes in today for his third dose of hep B. Administered in left deltoid IM. Patient tolerated well.

## 2018-04-20 ENCOUNTER — Ambulatory Visit: Payer: 59

## 2018-06-22 ENCOUNTER — Encounter: Payer: Self-pay | Admitting: Internal Medicine

## 2018-06-22 ENCOUNTER — Ambulatory Visit: Payer: 59 | Admitting: Internal Medicine

## 2018-06-22 VITALS — BP 120/88 | HR 81 | Temp 98.1°F | Resp 16 | Ht 71.0 in | Wt 219.4 lb

## 2018-06-22 DIAGNOSIS — B360 Pityriasis versicolor: Secondary | ICD-10-CM

## 2018-06-22 DIAGNOSIS — M545 Low back pain: Secondary | ICD-10-CM

## 2018-06-22 DIAGNOSIS — Z13220 Encounter for screening for lipoid disorders: Secondary | ICD-10-CM

## 2018-06-22 DIAGNOSIS — J309 Allergic rhinitis, unspecified: Secondary | ICD-10-CM | POA: Diagnosis not present

## 2018-06-22 DIAGNOSIS — R7303 Prediabetes: Secondary | ICD-10-CM

## 2018-06-22 DIAGNOSIS — Z1159 Encounter for screening for other viral diseases: Secondary | ICD-10-CM

## 2018-06-22 DIAGNOSIS — M1A9XX0 Chronic gout, unspecified, without tophus (tophi): Secondary | ICD-10-CM | POA: Diagnosis not present

## 2018-06-22 DIAGNOSIS — Z1389 Encounter for screening for other disorder: Secondary | ICD-10-CM

## 2018-06-22 DIAGNOSIS — Z0184 Encounter for antibody response examination: Secondary | ICD-10-CM

## 2018-06-22 DIAGNOSIS — Z1329 Encounter for screening for other suspected endocrine disorder: Secondary | ICD-10-CM

## 2018-06-22 DIAGNOSIS — E559 Vitamin D deficiency, unspecified: Secondary | ICD-10-CM

## 2018-06-22 DIAGNOSIS — G8929 Other chronic pain: Secondary | ICD-10-CM

## 2018-06-22 MED ORDER — KETOCONAZOLE 2 % EX SHAM: 1.0000 "application " | MEDICATED_SHAMPOO | CUTANEOUS | 11 refills | Status: DC

## 2018-06-22 MED ORDER — FLUTICASONE PROPIONATE 50 MCG/ACT NA SUSP: 2.0000 | Freq: Every day | NASAL | 11 refills | Status: DC

## 2018-06-22 MED ORDER — INDOMETHACIN 50 MG PO CAPS: ORAL_CAPSULE | ORAL | 3 refills | Status: DC

## 2018-06-22 MED ORDER — BACLOFEN 10 MG PO TABS: 10.0000 mg | ORAL_TABLET | Freq: Three times a day (TID) | ORAL | 11 refills | Status: DC

## 2018-06-22 NOTE — Progress Notes (Signed)
Chief Complaint  Patient presents with  . Follow-up   Follow up  1. Gout flare right big toe 3/10 tried indomethicin helped FH dad and brother gout. Pt declines starting allopurinol due to side effects dad and brother had upset stomach  2. Rash to chest using bactroban not helping  3. claritin helped tinnitis in ears  4. Intermittent low back pain baclofen prn helps does heavy lifting at work    Review of Systems  Constitutional: Positive for weight loss.  HENT: Negative for tinnitus.   Eyes: Negative for blurred vision.  Respiratory: Negative for shortness of breath.   Cardiovascular: Negative for chest pain.  Gastrointestinal: Negative for abdominal pain.  Musculoskeletal: Negative for back pain.  Skin: Positive for rash.  Neurological: Negative for headaches.  Psychiatric/Behavioral: Negative for depression.   Past Medical History:  Diagnosis Date  . Chicken pox   . Gout   . Seizures ([place])    Absence seizure as a child.   Past Surgical History:  Procedure Laterality Date  . FRACTURE SURGERY     Hand fracture   Family History  Problem Relation Age of Onset  . Testicular cancer Brother        dx age 85  . Arthritis Maternal Grandmother   . Breast cancer Maternal Grandmother   . Arthritis Paternal Grandfather   . Diabetes Paternal Grandfather    Social History   Socioeconomic History  . Marital status: Married    Spouse name: Not on file  . Number of children: Not on file  . Years of education: Not on file  . Highest education level: Not on file  Occupational History  . Not on file  Social Needs  . Financial resource strain: Not on file  . Food insecurity:    Worry: Not on file    Inability: Not on file  . Transportation needs:    Medical: Not on file    Non-medical: Not on file  Tobacco Use  . Smoking status: Former Smoker    Start date: 09/22/2007  . Smokeless tobacco: Former Systems developer    Types: [place] date: 06/25/2017  Substance and Sexual  Activity  . Alcohol use: No  . Drug use: No  . Sexual activity: Yes    Partners: Female  Lifestyle  . Physical activity:    Days per week: Not on file    Minutes per session: Not on file  . Stress: Not on file  Relationships  . Social connections:    Talks on phone: Not on file    Gets together: Not on file    Attends religious service: Not on file    Active member of club or organization: Not on file    Attends meetings of clubs or organizations: Not on file    Relationship status: Not on file  . Intimate partner violence:    Fear of current or ex partner: Not on file    Emotionally abused: Not on file    Physically abused: Not on file    Forced sexual activity: Not on file  Other Topics Concern  . Not on file  Social History Narrative   Works with hazardous waste    Current Meds  Medication Sig  . baclofen (LIORESAL) 10 MG tablet Take 1 tablet (10 mg total) by mouth 3 (three) times daily.  . fluticasone (FLONASE) 50 MCG/ACT nasal spray Place 2 sprays into both nostrils daily.  . indomethacin (INDOCIN) 50 MG capsule TAKE ONE  CAPSULE BY MOUTH 3 TIMES A DAY WITH FOOD **MAY STOP WHEN PAIN RESOLVED**   No Known Allergies No results found for this or any previous visit (from the past 2160 hour(s)). Objective  Body mass index is 30.6 kg/m. Wt Readings from Last 3 Encounters:  06/22/18 219 lb 6 oz (99.5 kg)  09/21/17 223 lb 6 oz (101.3 kg)  04/24/17 227 lb 2 oz (103 kg)   Temp Readings from Last 3 Encounters:  06/22/18 98.1 F (36.7 C) (Oral)  04/24/17 98.4 F (36.9 C) (Oral)  04/16/17 97.7 F (36.5 C) (Oral)   BP Readings from Last 3 Encounters:  06/22/18 120/88  09/21/17 116/74  04/24/17 120/80   Pulse Readings from Last 3 Encounters:  06/22/18 81  09/21/17 94  04/24/17 (!) 103    Physical Exam  Constitutional: He is oriented to person, place, and time. Vital signs are normal. He appears well-developed and well-nourished. He is cooperative.  HENT:  Head:  Normocephalic and atraumatic.  Mouth/Throat: Oropharynx is clear and moist and mucous membranes are normal.  Eyes: Pupils are equal, round, and reactive to light. Conjunctivae are normal.  Cardiovascular: Normal rate, regular rhythm and normal heart sounds.  Pulmonary/Chest: Effort normal and breath sounds normal.  Neurological: He is alert and oriented to person, place, and time. Gait normal.  Skin: Skin is warm and dry.     Psychiatric: He has a normal mood and affect. His speech is normal and behavior is normal. Judgment and thought content normal. Cognition and memory are normal.  Nursing note and vitals reviewed.   Assessment   1. Gout right great toe  2. t versicolor  3. Allergic rhinitis  4. Chronic low back pain w/o radiculopathy  5. HM Plan   1. Declines px tx  indomethicine  2. ketaconazole  3. Flonase, claritin  4. Denies low back pain  5.  Declines flu shot  Tdap had 11/2011  Declines STD check been with wife x years married since 2013  Congratulated on quitting smoking and chew  -former smoker quit 10 years ago smoked x 10 years 1 ppd no FH lung cancer     Provider: Dr. Olivia Mackie McLean-Scocuzza-Internal Medicine

## 2018-06-22 NOTE — Patient Instructions (Addendum)
Use ketoconazole shampoo to chest if this doesn't selsun blue otc  zevia soda mt dew flavor   Tinea Versicolor Tinea versicolor is a common fungal infection of the skin. It causes a rash that appears as light or dark patches on the skin. The rash most often occurs on the chest, back, neck, or upper arms. This condition is more common during warm weather. Other than affecting how your skin looks, tinea versicolor usually does not cause other problems. In most cases, the infection goes away in a few weeks with treatment. It may take a few months for the patches on your skin to clear up. What are the causes? Tinea versicolor occurs when a type of fungus that is normally present on the skin starts to overgrow. This fungus is a kind of yeast. The exact cause of the overgrowth is not known. This condition cannot be passed from one person to another (noncontagious). What increases the risk? This condition is more likely to develop when certain factors are present, such as:  Heat and humidity.  Sweating too much.  Hormone changes.  Oily skin.  A weak defense (immune) system.  What are the signs or symptoms? Symptoms of this condition may include:  A rash on your skin that is made up of light or dark patches. The rash may have: ? Patches of tan or pink spots on light skin. ? Patches of white or brown spots on dark skin. ? Patches of skin that do not tan. ? Well-marked edges. ? Scales on the discolored areas.  Mild itching.  How is this diagnosed? A health care provider can usually diagnose this condition by looking at your skin. During the exam, he or she may use ultraviolet light to help determine the extent of the infection. In some cases, a skin sample may be taken by scraping the rash. This sample will be viewed under a microscope to check for yeast overgrowth. How is this treated? Treatment for this condition may include:  Dandruff shampoo that is applied to the affected skin during  showers or bathing.  Over-the-counter medicated skin cream, lotion, or soaps.  Prescription antifungal medicine in the form of skin cream or pills.  Medicine to help reduce itching.  Follow these instructions at home:  Take medicines only as directed by your health care provider.  Apply dandruff shampoo to the affected area if told to do so by your health care provider. You may be instructed to scrub the affected skin for several minutes each day.  Do not scratch the affected area of skin.  Avoid hot and humid conditions.  Do not use tanning booths.  Try to avoid sweating a lot. Contact a health care provider if:  Your symptoms get worse.  You have a fever.  You have redness, swelling, or pain at the site of your rash.  You have fluid, blood, or pus coming from your rash.  Your rash returns after treatment. This information is not intended to replace advice given to you by your health care provider. Make sure you discuss any questions you have with your health care provider. Document Released: 09/12/2000 Document Revised: 05/18/2016 Document Reviewed: 06/27/2014 Elsevier Interactive Patient Education  2018 [employer].

## 2018-09-27 ENCOUNTER — Other Ambulatory Visit: Payer: 59

## 2018-10-07 ENCOUNTER — Other Ambulatory Visit: Payer: 59

## 2018-10-14 ENCOUNTER — Ambulatory Visit: Payer: 59 | Admitting: Internal Medicine

## 2018-11-11 ENCOUNTER — Encounter: Payer: Self-pay | Admitting: Internal Medicine

## 2018-11-11 ENCOUNTER — Ambulatory Visit: Payer: 59 | Admitting: Internal Medicine

## 2018-11-11 VITALS — BP 126/60 | HR 94 | Temp 98.6°F | Ht 71.0 in | Wt 226.6 lb

## 2018-11-11 DIAGNOSIS — Z1389 Encounter for screening for other disorder: Secondary | ICD-10-CM

## 2018-11-11 DIAGNOSIS — Z0184 Encounter for antibody response examination: Secondary | ICD-10-CM | POA: Diagnosis not present

## 2018-11-11 DIAGNOSIS — R7303 Prediabetes: Secondary | ICD-10-CM

## 2018-11-11 DIAGNOSIS — Z1329 Encounter for screening for other suspected endocrine disorder: Secondary | ICD-10-CM | POA: Diagnosis not present

## 2018-11-11 DIAGNOSIS — Z1159 Encounter for screening for other viral diseases: Secondary | ICD-10-CM

## 2018-11-11 DIAGNOSIS — Z0000 Encounter for general adult medical examination without abnormal findings: Secondary | ICD-10-CM | POA: Diagnosis not present

## 2018-11-11 DIAGNOSIS — Z13220 Encounter for screening for lipoid disorders: Secondary | ICD-10-CM | POA: Diagnosis not present

## 2018-11-11 DIAGNOSIS — M109 Gout, unspecified: Secondary | ICD-10-CM

## 2018-11-11 DIAGNOSIS — E559 Vitamin D deficiency, unspecified: Secondary | ICD-10-CM | POA: Diagnosis not present

## 2018-11-11 LAB — COMPREHENSIVE METABOLIC PANEL
ALBUMIN: 4.9 g/dL (ref 3.5–5.2)
ALT: 99 U/L — ABNORMAL HIGH (ref 0–53)
AST: 55 U/L — ABNORMAL HIGH (ref 0–37)
Alkaline Phosphatase: 75 U/L (ref 39–117)
BUN: 18 mg/dL (ref 6–23)
CALCIUM: 9.6 mg/dL (ref 8.4–10.5)
CO2: 30 mEq/L (ref 19–32)
Chloride: 102 mEq/L (ref 96–112)
Creatinine, Ser: 1.07 mg/dL (ref 0.40–1.50)
GFR: 77.23 mL/min (ref >60.00)
Glucose, Bld: 98 mg/dL (ref 70–99)
Potassium: 4.7 mEq/L (ref 3.5–5.1)
Sodium: 139 mEq/L (ref 135–145)
Total Bilirubin: 0.7 mg/dL (ref 0.2–1.2)
Total Protein: 8 g/dL (ref 6.0–8.3)

## 2018-11-11 LAB — TSH: TSH: 1.62 u[IU]/mL (ref 0.35–4.50)

## 2018-11-11 LAB — URINALYSIS, ROUTINE W REFLEX MICROSCOPIC
Bilirubin Urine: NEGATIVE
Hgb urine dipstick: NEGATIVE
Ketones, ur: NEGATIVE
Leukocytes,Ua: NEGATIVE
Nitrite: NEGATIVE
RBC / HPF: NONE SEEN (ref 0–?)
Specific Gravity, Urine: 1.015 (ref 1.000–1.030)
Total Protein, Urine: NEGATIVE
URINE GLUCOSE: NEGATIVE
UROBILINOGEN UA: 0.2 (ref 0.0–1.0)
pH: 5.5 (ref 5.0–8.0)

## 2018-11-11 LAB — VITAMIN D 25 HYDROXY (VIT D DEFICIENCY, FRACTURES): VITD: 22.38 ng/mL — ABNORMAL LOW (ref 30.00–100.00)

## 2018-11-11 LAB — CBC WITH DIFFERENTIAL/PLATELET
BASOS ABS: 0 10*3/uL (ref 0.0–0.1)
BASOS PCT: 0.4 % (ref 0.0–3.0)
Eosinophils Absolute: 0.1 10*3/uL (ref 0.0–0.7)
Eosinophils Relative: 3 % (ref 0.0–5.0)
HEMATOCRIT: 45.3 % (ref 39.0–52.0)
HEMOGLOBIN: 14.9 g/dL (ref 13.0–17.0)
LYMPHS PCT: 34.8 % (ref 12.0–46.0)
Lymphs Abs: 1.4 10*3/uL (ref 0.7–4.0)
MCHC: 32.9 g/dL (ref 30.0–36.0)
MCV: 92.5 fl (ref 78.0–100.0)
Monocytes Absolute: 0.4 10*3/uL (ref 0.1–1.0)
Monocytes Relative: 10.4 % (ref 3.0–12.0)
Neutro Abs: 2.1 10*3/uL (ref 1.4–7.7)
Neutrophils Relative %: 51.4 % (ref 43.0–77.0)
Platelets: 202 10*3/uL (ref 150.0–400.0)
RBC: 4.89 Mil/uL (ref 4.22–5.81)
RDW: 14.8 % (ref 11.5–15.5)
WBC: 4 10*3/uL (ref 4.0–10.5)

## 2018-11-11 LAB — LIPID PANEL
Cholesterol: 204 mg/dL — ABNORMAL HIGH (ref 0–200)
HDL: 53.3 mg/dL (ref >39.00)
LDL Cholesterol: 143 mg/dL — ABNORMAL HIGH (ref 0–99)
NonHDL: 151.04
TRIGLYCERIDES: 40 mg/dL (ref 0.0–149.0)
Total CHOL/HDL Ratio: 4
VLDL: 8 mg/dL (ref 0.0–40.0)

## 2018-11-11 LAB — HEMOGLOBIN A1C: Hgb A1c MFr Bld: 5.8 % (ref 4.6–6.5)

## 2018-11-11 LAB — T4, FREE: Free T4: 0.88 ng/dL (ref 0.60–1.60)

## 2018-11-11 LAB — URIC ACID: Uric Acid, Serum: 11.3 mg/dL — ABNORMAL HIGH (ref 4.0–7.8)

## 2018-11-11 NOTE — Addendum Note (Signed)
Addended by: Arby Barrette on: 11/11/2018 09:18 AM   Modules accepted: Orders

## 2018-11-11 NOTE — Progress Notes (Signed)
Pre visit review using our clinic review tool, if applicable. No additional management support is needed unless otherwise documented below in the visit note. 

## 2018-11-11 NOTE — Progress Notes (Signed)
Chief Complaint  Patient presents with  . Annual Exam   Annual doing well  Rash to chest is improved with nizoral shampoo  Gout controlled for now doing tart cherry supplement otc pork is trigger for gout    Review of Systems  Constitutional: Negative for weight loss.  HENT: Negative for hearing loss.   Eyes: Negative for blurred vision.  Respiratory: Negative for shortness of breath.   Cardiovascular: Negative for chest pain.  Gastrointestinal: Negative for abdominal pain.  Musculoskeletal: Negative for joint pain.  Skin: Negative for rash.  Neurological: Negative for headaches.  Psychiatric/Behavioral: Negative for depression.   Past Medical History:  Diagnosis Date  . Chicken pox   . Gout   . Seizures ([place])    Absence seizure as a child.   Past Surgical History:  Procedure Laterality Date  . FRACTURE SURGERY     Hand fracture   Family History  Problem Relation Age of Onset  . Testicular cancer Brother        dx age 100  . Cancer Brother        testicular  . Arthritis Maternal Grandmother   . Breast cancer Maternal Grandmother   . Arthritis Paternal Grandfather   . Diabetes Paternal Grandfather    Social History   Socioeconomic History  . Marital status: Married    Spouse name: Not on file  . Number of children: Not on file  . Years of education: Not on file  . Highest education level: Not on file  Occupational History  . Not on file  Social Needs  . Financial resource strain: Not on file  . Food insecurity:    Worry: Not on file    Inability: Not on file  . Transportation needs:    Medical: Not on file    Non-medical: Not on file  Tobacco Use  . Smoking status: Former Smoker    Start date: 09/22/2007  . Smokeless tobacco: Former Systems developer    Types: [place] date: 06/25/2017  Substance and Sexual Activity  . Alcohol use: No  . Drug use: No  . Sexual activity: Yes    Partners: Female  Lifestyle  . Physical activity:    Days per week: Not on  file    Minutes per session: Not on file  . Stress: Not on file  Relationships  . Social connections:    Talks on phone: Not on file    Gets together: Not on file    Attends religious service: Not on file    Active member of club or organization: Not on file    Attends meetings of clubs or organizations: Not on file    Relationship status: Not on file  . Intimate partner violence:    Fear of current or ex partner: Not on file    Emotionally abused: Not on file    Physically abused: Not on file    Forced sexual activity: Not on file  Other Topics Concern  . Not on file  Social History Narrative   Works with hazardous waste    Current Meds  Medication Sig  . baclofen (LIORESAL) 10 MG tablet Take 1 tablet (10 mg total) by mouth 3 (three) times daily.  . fluticasone (FLONASE) 50 MCG/ACT nasal spray Place 2 sprays into both nostrils daily.  . indomethacin (INDOCIN) 50 MG capsule TAKE ONE CAPSULE BY MOUTH 3 TIMES A DAY WITH FOOD **MAY STOP WHEN PAIN RESOLVED**  . ketoconazole (NIZORAL) 2 % shampoo  Apply 1 application topically 2 (two) times a week. Lather and let sit x 5 minutes  . mupirocin ointment (BACTROBAN) 2 % Apply topical to affected area daily.   No Known Allergies No results found for this or any previous visit (from the past 2160 hour(s)). Objective  Body mass index is 31.6 kg/m. Wt Readings from Last 3 Encounters:  11/11/18 226 lb 9.6 oz (102.8 kg)  06/22/18 219 lb 6 oz (99.5 kg)  09/21/17 223 lb 6 oz (101.3 kg)   Temp Readings from Last 3 Encounters:  11/11/18 98.6 F (37 C) (Oral)  06/22/18 98.1 F (36.7 C) (Oral)  04/24/17 98.4 F (36.9 C) (Oral)   BP Readings from Last 3 Encounters:  11/11/18 126/60  06/22/18 120/88  09/21/17 116/74   Pulse Readings from Last 3 Encounters:  11/11/18 94  06/22/18 81  09/21/17 94    Physical Exam Vitals signs and nursing note reviewed.  Constitutional:      Appearance: Normal appearance. He is well-developed and  well-groomed.  HENT:     Head: Normocephalic and atraumatic.     Nose: Nose normal.     Mouth/Throat:     Mouth: Mucous membranes are moist.     Pharynx: Oropharynx is clear.  Eyes:     Conjunctiva/sclera: Conjunctivae normal.     Pupils: Pupils are equal, round, and reactive to light.  Cardiovascular:     Rate and Rhythm: Normal rate and regular rhythm.     Heart sounds: Normal heart sounds. No murmur.  Pulmonary:     Effort: Pulmonary effort is normal.     Breath sounds: Normal breath sounds.  Skin:    General: Skin is warm and dry.     Findings: No rash.  Neurological:     General: No focal deficit present.     Mental Status: He is alert and oriented to person, place, and time. Mental status is at baseline.     Gait: Gait normal.  Psychiatric:        Attention and Perception: Attention and perception normal.        Mood and Affect: Mood and affect normal.        Speech: Speech normal.        Behavior: Behavior normal. Behavior is cooperative.        Thought Content: Thought content normal.        Cognition and Memory: Cognition and memory normal.        Judgment: Judgment normal.     Assessment   1. Annual  2. T. versicolar controlled  3. Gout controlled for now  Plan  1.  Declines flu shot  Tdap had 11/2011  Declines STD check been with wife x years married since 2013 Congratulated on quitting smoking and chew  -former smoker quit 10 years ago smoked x 10 years 1 ppd no FH lung cancer  Check fasting labs today rec healthy diet and exercise   2. nizoral prn  3. Check uric acid   Provider: Dr. Olivia Mackie McLean-[facility]oc[facility]

## 2018-11-11 NOTE — Patient Instructions (Signed)
Gout    Gout is a condition that causes painful swelling of the joints. Gout is a type of inflammation of the joints (arthritis). This condition is caused by having too much uric acid in the body. Uric acid is a chemical that forms when the body breaks down substances called purines. Purines are important for building body proteins.  When the body has too much uric acid, sharp crystals can form and build up inside the joints. This causes pain and swelling. Gout attacks can happen quickly and may be very painful (acute gout). Over time, the attacks can affect more joints and become more frequent (chronic gout). Gout can also cause uric acid to build up under the skin and inside the kidneys.  What are the causes?  This condition is caused by too much uric acid in your blood. This can happen because:   Your kidneys do not remove enough uric acid from your blood. This is the most common cause.   Your body makes too much uric acid. This can happen with some cancers and cancer treatments. It can also occur if your body is breaking down too many red blood cells (hemolytic anemia).   You eat too many foods that are high in purines. These foods include organ meats and some seafood. Alcohol, especially beer, is also high in purines.  A gout attack may be triggered by trauma or stress.  What increases the risk?  You are more likely to develop this condition if you:   Have a family history of gout.   Are male and middle-aged.   Are male and have gone through menopause.   Are obese.   Frequently drink alcohol, especially beer.   Are dehydrated.   Lose weight too quickly.   Have an organ transplant.   Have lead poisoning.   Take certain medicines, including aspirin, cyclosporine, diuretics, levodopa, and niacin.   Have kidney disease.   Have a skin condition called psoriasis.  What are the signs or symptoms?  An attack of acute gout happens quickly. It usually occurs in just one joint. The most common place is  the big toe. Attacks often start at night. Other joints that may be affected include joints of the feet, ankle, knee, fingers, wrist, or elbow. Symptoms of this condition may include:   Severe pain.   Warmth.   Swelling.   Stiffness.   Tenderness. The affected joint may be very painful to touch.   Shiny, red, or purple skin.   Chills and fever.  Chronic gout may cause symptoms more frequently. More joints may be involved. You may also have white or yellow lumps (tophi) on your hands or feet or in other areas near your joints.  How is this diagnosed?  This condition is diagnosed based on your symptoms, medical history, and physical exam. You may have tests, such as:   Blood tests to measure uric acid levels.   Removal of joint fluid with a thin needle (aspiration) to look for uric acid crystals.   X-rays to look for joint damage.  How is this treated?  Treatment for this condition has two phases: treating an acute attack and preventing future attacks. Acute gout treatment may include medicines to reduce pain and swelling, including:   NSAIDs.   Steroids. These are strong anti-inflammatory medicines that can be taken by mouth (orally) or injected into a joint.   Colchicine. This medicine relieves pain and swelling when it is taken soon after an attack. It   can be given by mouth or through an IV.  Preventive treatment may include:   Daily use of smaller doses of NSAIDs or colchicine.   Use of a medicine that reduces uric acid levels in your blood.   Changes to your diet. You may need to see a dietitian about what to eat and drink to prevent gout.  Follow these instructions at home:  During a gout attack     If directed, put ice on the affected area:  ? Put ice in a plastic bag.  ? Place a towel between your skin and the bag.  ? Leave the ice on for 20 minutes, 2-3 times a day.   Raise (elevate) the affected joint above the level of your heart as often as possible.   Rest the joint as much as possible.  If the affected joint is in your leg, you may be given crutches to use.   Follow instructions from your health care provider about eating or drinking restrictions.  Avoiding future gout attacks   Follow a low-purine diet as told by your dietitian or health care provider. Avoid foods and drinks that are high in purines, including liver, kidney, anchovies, asparagus, herring, mushrooms, mussels, and beer.   Maintain a healthy weight or lose weight if you are overweight. If you want to lose weight, talk with your health care provider. It is important that you do not lose weight too quickly.   Start or maintain an exercise program as told by your health care provider.  Eating and drinking   Drink enough fluids to keep your urine pale yellow.   If you drink alcohol:  ? Limit how much you use to:   0-1 drink a day for women.   0-2 drinks a day for men.  ? Be aware of how much alcohol is in your drink. In the [place] one drink equals one 12 oz bottle of beer (355 mL) one 5 oz glass of wine (148 mL), or one 1 oz glass of hard liquor (44 mL).  General instructions   Take over-the-counter and prescription medicines only as told by your health care provider.   Do not drive or use heavy machinery while taking prescription pain medicine.   Return to your normal activities as told by your health care provider. Ask your health care provider what activities are safe for you.   Keep all follow-up visits as told by your health care provider. This is important.  Contact a health care provider if you have:   Another gout attack.   Continuing symptoms of a gout attack after 10 days of treatment.   Side effects from your medicines.   Chills or a fever.   Burning pain when you urinate.   Pain in your lower back or belly.  Get help right away if you:   Have severe or uncontrolled pain.   Cannot urinate.  Summary   Gout is painful swelling of the joints caused by inflammation.   The most common site of pain is the big  toe, but it can affect other joints in the body.   Medicines and dietary changes can help to prevent and treat gout attacks.  This information is not intended to replace advice given to you by your health care provider. Make sure you discuss any questions you have with your health care provider.  Document Released: 09/12/2000 Document Revised: 04/07/2018 Document Reviewed: 04/07/2018  Elsevier Interactive Patient Education  2019 [employer]

## 2018-11-12 ENCOUNTER — Other Ambulatory Visit: Payer: Self-pay | Admitting: Internal Medicine

## 2018-11-12 ENCOUNTER — Telehealth: Payer: Self-pay | Admitting: Internal Medicine

## 2018-11-12 DIAGNOSIS — R748 Abnormal levels of other serum enzymes: Secondary | ICD-10-CM

## 2018-11-12 LAB — MEASLES/MUMPS/RUBELLA IMMUNITY
Mumps IgG: 58.3 AU/mL
Rubella: 1.39 index
Rubeola IgG: >300 AU/mL

## 2018-11-12 LAB — HEPATITIS B SURFACE ANTIBODY, QUANTITATIVE: HEPATITIS B-POST: 213 m[IU]/mL (ref >=10)

## 2018-11-12 NOTE — Telephone Encounter (Signed)
Copied from [place] [phone]. Topic: Quick Communication - See Telephone Encounter >> Nov 12, 2018  5:12 PM Blase Mess A wrote: CRM for notification. See Telephone encounter for: 11/12/18.  Patient is calling back for his lab results please advise

## 2018-11-18 ENCOUNTER — Other Ambulatory Visit: Payer: Self-pay | Admitting: Internal Medicine

## 2018-11-18 DIAGNOSIS — R748 Abnormal levels of other serum enzymes: Secondary | ICD-10-CM

## 2018-11-22 ENCOUNTER — Ambulatory Visit: Payer: 59

## 2018-11-25 ENCOUNTER — Ambulatory Visit
Admission: RE | Admit: 2018-11-25 | Discharge: 2018-11-25 | Disposition: A | Discharge status: Home / Self Care | Payer: 59 | Source: Ambulatory Visit | Attending: Internal Medicine | Admitting: Internal Medicine

## 2018-11-25 DIAGNOSIS — R748 Abnormal levels of other serum enzymes: Secondary | ICD-10-CM | POA: Diagnosis present

## 2018-11-25 DIAGNOSIS — K760 Fatty (change of) liver, not elsewhere classified: Secondary | ICD-10-CM | POA: Diagnosis not present

## 2018-11-28 ENCOUNTER — Telehealth: Payer: Self-pay | Admitting: Internal Medicine

## 2018-11-28 NOTE — Telephone Encounter (Signed)
Call pt to schedule repeat liver enzymes lab visit  Then make f/u to disc fatty liver with me   [place]

## 2018-11-29 NOTE — Telephone Encounter (Signed)
Patient is gonna have labs and appointment on wed @1300 

## 2018-12-01 ENCOUNTER — Ambulatory Visit: Payer: 59 | Admitting: Internal Medicine

## 2018-12-01 ENCOUNTER — Encounter: Payer: Self-pay | Admitting: Internal Medicine

## 2018-12-01 VITALS — BP 125/80 | HR 91 | Temp 98.3°F | Ht 71.0 in | Wt 221.0 lb

## 2018-12-01 DIAGNOSIS — Z0000 Encounter for general adult medical examination without abnormal findings: Secondary | ICD-10-CM | POA: Diagnosis not present

## 2018-12-01 DIAGNOSIS — K760 Fatty (change of) liver, not elsewhere classified: Secondary | ICD-10-CM | POA: Diagnosis not present

## 2018-12-01 DIAGNOSIS — E785 Hyperlipidemia, unspecified: Secondary | ICD-10-CM

## 2018-12-01 DIAGNOSIS — M109 Gout, unspecified: Secondary | ICD-10-CM

## 2018-12-01 DIAGNOSIS — Z1159 Encounter for screening for other viral diseases: Secondary | ICD-10-CM | POA: Diagnosis not present

## 2018-12-01 DIAGNOSIS — E559 Vitamin D deficiency, unspecified: Secondary | ICD-10-CM | POA: Insufficient documentation

## 2018-12-01 DIAGNOSIS — R7303 Prediabetes: Secondary | ICD-10-CM

## 2018-12-01 DIAGNOSIS — R748 Abnormal levels of other serum enzymes: Secondary | ICD-10-CM

## 2018-12-01 DIAGNOSIS — Z0184 Encounter for antibody response examination: Secondary | ICD-10-CM | POA: Diagnosis not present

## 2018-12-01 DIAGNOSIS — Z1389 Encounter for screening for other disorder: Secondary | ICD-10-CM

## 2018-12-01 DIAGNOSIS — Z1329 Encounter for screening for other suspected endocrine disorder: Secondary | ICD-10-CM

## 2018-12-01 LAB — HEPATIC FUNCTION PANEL
ALT: 69 U/L — ABNORMAL HIGH (ref 0–53)
AST: 34 U/L (ref 0–37)
Albumin: 4.8 g/dL (ref 3.5–5.2)
Alkaline Phosphatase: 82 U/L (ref 39–117)
Bilirubin, Direct: 0.2 mg/dL (ref 0.0–0.3)
Total Bilirubin: 0.5 mg/dL (ref 0.2–1.2)
Total Protein: 8.4 g/dL — ABNORMAL HIGH (ref 6.0–8.3)

## 2018-12-01 NOTE — Progress Notes (Signed)
Chief Complaint  Patient presents with  . Follow-up    fatty liver   F/u  Labs and fatty liver he reports drinking a lot of alcohol daily but has cut back and [place] + fatty liver   Review of Systems  Constitutional: Positive for weight loss.       Down 4 lbs    HENT: Negative for hearing loss.   Eyes: Negative for blurred vision.  Respiratory: Negative for shortness of breath.   Cardiovascular: Negative for chest pain.  Gastrointestinal: Negative for abdominal pain.  Musculoskeletal: Negative for falls.  Skin: Negative for rash.  Neurological: Negative for headaches.  Psychiatric/Behavioral: Negative for depression.   Past Medical History:  Diagnosis Date  . Chicken pox   . Gout   . Seizures ([place])    Absence seizure as a child.   Past Surgical History:  Procedure Laterality Date  . FRACTURE SURGERY     Hand fracture   Family History  Problem Relation Age of Onset  . Testicular cancer Brother        dx age 57  . Cancer Brother        testicular  . Arthritis Maternal Grandmother   . Breast cancer Maternal Grandmother   . Arthritis Paternal Grandfather   . Diabetes Paternal Grandfather    Social History   Socioeconomic History  . Marital status: Married    Spouse name: Not on file  . Number of children: Not on file  . Years of education: Not on file  . Highest education level: Not on file  Occupational History  . Not on file  Social Needs  . Financial resource strain: Not on file  . Food insecurity:    Worry: Not on file    Inability: Not on file  . Transportation needs:    Medical: Not on file    Non-medical: Not on file  Tobacco Use  . Smoking status: Former Smoker    Start date: 09/22/2007  . Smokeless tobacco: Former Systems developer    Types: [place] date: 06/25/2017  Substance and Sexual Activity  . Alcohol use: No  . Drug use: No  . Sexual activity: Yes    Partners: Female  Lifestyle  . Physical activity:    Days per week: Not on file    Minutes  per session: Not on file  . Stress: Not on file  Relationships  . Social connections:    Talks on phone: Not on file    Gets together: Not on file    Attends religious service: Not on file    Active member of club or organization: Not on file    Attends meetings of clubs or organizations: Not on file    Relationship status: Not on file  . Intimate partner violence:    Fear of current or ex partner: Not on file    Emotionally abused: Not on file    Physically abused: Not on file    Forced sexual activity: Not on file  Other Topics Concern  . Not on file  Social History Narrative   Works with hazardous waste    Current Meds  Medication Sig  . baclofen (LIORESAL) 10 MG tablet Take 1 tablet (10 mg total) by mouth 3 (three) times daily.  . fluticasone (FLONASE) 50 MCG/ACT nasal spray Place 2 sprays into both nostrils daily.  . indomethacin (INDOCIN) 50 MG capsule TAKE ONE CAPSULE BY MOUTH 3 TIMES A DAY WITH FOOD **MAY STOP WHEN  PAIN RESOLVED**  . ketoconazole (NIZORAL) 2 % shampoo Apply 1 application topically 2 (two) times a week. Lather and let sit x 5 minutes  . mupirocin ointment (BACTROBAN) 2 % Apply topical to affected area daily.   No Known Allergies Recent Results (from the past 2160 hour(s))  Comprehensive metabolic panel     Status: Abnormal   Collection Time: 11/11/18  9:08 AM  Result Value Ref Range   Sodium 139 135 - 145 mEq/L   Potassium 4.7 3.5 - 5.1 mEq/L   Chloride 102 96 - 112 mEq/L   CO2 30 19 - 32 mEq/L   Glucose, Bld 98 70 - 99 mg/dL   BUN 18 6 - 23 mg/dL   Creatinine, Ser 1.07 0.40 - 1.50 mg/dL   Total Bilirubin 0.7 0.2 - 1.2 mg/dL   Alkaline Phosphatase 75 39 - 117 U/L   AST 55 (H) 0 - 37 U/L   ALT 99 (H) 0 - 53 U/L   Total Protein 8.0 6.0 - 8.3 g/dL   Albumin 4.9 3.5 - 5.2 g/dL   Calcium 9.6 8.4 - 10.5 mg/dL   GFR 77.23 >60.00 mL/min  CBC with Differential/Platelet     Status: None   Collection Time: 11/11/18  9:08 AM  Result Value Ref Range   WBC  4.0 4.0 - 10.5 K/uL   RBC 4.89 4.22 - 5.81 Mil/uL   Hemoglobin 14.9 13.0 - 17.0 g/dL   HCT 45.3 39.0 - 52.0 %   MCV 92.5 78.0 - 100.0 fl   MCHC 32.9 30.0 - 36.0 g/dL   RDW 14.8 11.5 - 15.5 %   Platelets 202.0 150.0 - 400.0 K/uL   Neutrophils Relative % 51.4 43.0 - 77.0 %   Lymphocytes Relative 34.8 12.0 - 46.0 %   Monocytes Relative 10.4 3.0 - 12.0 %   Eosinophils Relative 3.0 0.0 - 5.0 %   Basophils Relative 0.4 0.0 - 3.0 %   Neutro Abs 2.1 1.4 - 7.7 K/uL   Lymphs Abs 1.4 0.7 - 4.0 K/uL   Monocytes Absolute 0.4 0.1 - 1.0 K/uL   Eosinophils Absolute 0.1 0.0 - 0.7 K/uL   Basophils Absolute 0.0 0.0 - 0.1 K/uL  Lipid panel     Status: Abnormal   Collection Time: 11/11/18  9:08 AM  Result Value Ref Range   Cholesterol 204 (H) 0 - 200 mg/dL    Comment: ATP III Classification       Desirable:  < 200 mg/dL               Borderline High:  200 - 239 mg/dL          High:  > = 240 mg/dL   Triglycerides 40.0 0.0 - 149.0 mg/dL    Comment: Normal:  <150 mg/dLBorderline High:  150 - 199 mg/dL   HDL 53.30 >39.00 mg/dL   VLDL 8.0 0.0 - 40.0 mg/dL   LDL Cholesterol 143 (H) 0 - 99 mg/dL   Total CHOL/HDL Ratio 4     Comment:                Men          Women1/2 Average Risk     3.4          3.3Average Risk          5.0          4.42X Average Risk          9.6  7.13X Average Risk          15.0          11.0                       NonHDL 151.04     Comment: NOTE:  Non-HDL goal should be 30 mg/dL higher than patient's LDL goal (i.e. LDL goal of < 70 mg/dL, would have non-HDL goal of < 100 mg/dL)  Hemoglobin A1c     Status: None   Collection Time: 11/11/18  9:08 AM  Result Value Ref Range   Hgb A1c MFr Bld 5.8 4.6 - 6.5 %    Comment: Glycemic Control Guidelines for People with Diabetes:Non Diabetic:  <6%Goal of Therapy: <7%Additional Action Suggested:  >8%   TSH     Status: None   Collection Time: 11/11/18  9:08 AM  Result Value Ref Range   TSH 1.62 0.35 - 4.50 uIU/mL  T4, free     Status:  None   Collection Time: 11/11/18  9:08 AM  Result Value Ref Range   Free T4 0.88 0.60 - 1.60 ng/dL    Comment: Specimens from patients who are undergoing biotin therapy and /or ingesting biotin supplements may contain high levels of biotin.  The higher biotin concentration in these specimens interferes with this Free T4 assay.  Specimens that contain high levels  of biotin may cause false high results for this Free T4 assay.  Please interpret results in light of the total clinical presentation of the patient.    Urinalysis, Routine w reflex microscopic     Status: None   Collection Time: 11/11/18  9:08 AM  Result Value Ref Range   Color, Urine YELLOW Yellow;Lt. Yellow;Straw;Dark Yellow;Amber;Green;Red;Brown   APPearance CLEAR Clear;Turbid;Slightly Cloudy;Cloudy   Specific Gravity, Urine 1.015 1.000 - 1.030   pH 5.5 5.0 - 8.0   Total Protein, Urine NEGATIVE Negative   Urine Glucose NEGATIVE Negative   Ketones, ur NEGATIVE Negative   Bilirubin Urine NEGATIVE Negative   Hgb urine dipstick NEGATIVE Negative   Urobilinogen, UA 0.2 0.0 - 1.0   Leukocytes,Ua NEGATIVE Negative   Nitrite NEGATIVE Negative   WBC, UA 0-2/hpf 0-2/hpf   RBC / HPF none seen 0-2/hpf   Squamous Epithelial / LPF Rare(0-4/hpf) Rare(0-4/hpf)  Uric acid     Status: Abnormal   Collection Time: 11/11/18  9:08 AM  Result Value Ref Range   Uric Acid, Serum 11.3 (H) 4.0 - 7.8 mg/dL  Hepatitis B surface antibody,quantitative     Status: None   Collection Time: 11/11/18  9:08 AM  Result Value Ref Range   Hepatitis B-Post 213 > OR = 10 mIU/mL    Comment: . Patient has immunity to hepatitis B virus. . For additional information, please refer to http://education.[facility].com/faq/FAQ105 (This link is being provided for informational/ educational purposes only).   Vitamin D (25 hydroxy)     Status: Abnormal   Collection Time: 11/11/18  9:08 AM  Result Value Ref Range   VITD 22.38 (L) 30.00 - 100.00 ng/mL   Measles/Mumps/Rubella Immunity     Status: None   Collection Time: 11/11/18  9:18 AM  Result Value Ref Range   [place] IgG >300.00 AU/mL    Comment: AU/mL            Interpretation -----            -------------- <13.50           Negative 13.50-16.49  Equivocal >16.49           Positive . A positive result indicates that the patient has antibody to measles virus. It does not differentiate  between an active or past infection. The clinical  diagnosis must be interpreted in conjunction with  clinical signs and symptoms of the patient.    Mumps IgG 58.30 AU/mL    Comment:  AU/mL           Interpretation -------         ---------------- <9.00             Negative 9.00-10.99        Equivocal >10.99            Positive A positive result indicates that the patient has  antibody to mumps virus. It does not differentiate between an  active or past infection. The clinical diagnosis must be interpreted in conjunction with clinical signs and symptoms of the patient. .    Rubella 1.39 index    Comment:     Index            Interpretation     -----            --------------       <0.90            Not consistent with Immunity     0.90-0.99        Equivocal     > or = 1.00      Consistent with Immunity  . The presence of rubella IgG antibody suggests  immunization or past or current infection with rubella virus.    Objective  Body mass index is 30.82 kg/m. Wt Readings from Last 3 Encounters:  12/01/18 221 lb (100.2 kg)  11/11/18 226 lb 9.6 oz (102.8 kg)  06/22/18 219 lb 6 oz (99.5 kg)   Temp Readings from Last 3 Encounters:  12/01/18 98.3 F (36.8 C) (Oral)  11/11/18 98.6 F (37 C) (Oral)  06/22/18 98.1 F (36.7 C) (Oral)   BP Readings from Last 3 Encounters:  12/01/18 125/80  11/11/18 126/60  06/22/18 120/88   Pulse Readings from Last 3 Encounters:  12/01/18 91  11/11/18 94  06/22/18 81    Physical Exam Vitals signs and nursing note reviewed.   Constitutional:      Appearance: Normal appearance. He is well-developed and well-groomed. He is obese.  HENT:     Head: Normocephalic and atraumatic.     Nose: Nose normal.     Mouth/Throat:     Mouth: Mucous membranes are moist.     Pharynx: Oropharynx is clear.  Eyes:     Conjunctiva/sclera: Conjunctivae normal.     Pupils: Pupils are equal, round, and reactive to light.  Cardiovascular:     Rate and Rhythm: Normal rate and regular rhythm.     Heart sounds: Normal heart sounds. No murmur.  Pulmonary:     Effort: Pulmonary effort is normal.     Breath sounds: Normal breath sounds.  Skin:    General: Skin is warm and dry.  Neurological:     General: No focal deficit present.     Mental Status: He is alert and oriented to person, place, and time. Mental status is at baseline.     Gait: Gait normal.  Psychiatric:        Attention and Perception: Attention and perception normal.        Mood and Affect: Mood and affect normal.  Speech: Speech normal.        Behavior: Behavior normal. Behavior is cooperative.        Thought Content: Thought content normal.        Cognition and Memory: Cognition and memory normal.        Judgment: Judgment normal.     Assessment   1. Fatty liver  2. HM Plan   1. Check hep A and lfts today  Hep B immune  2.  Declines flu shot Tdap had 11/2011  Hep B immune and MMR immune Declines STD check been with wife x years married since 2013 Congratulated on quitting smoking and chew  -former smoker quit 10 years ago smoked x 10 years 1 ppd no FH lung cancer  Check fasting labs 10/2019 and f/u in 1 week  rec healthy diet and exercise   rec take D3 5000 IU daily   Provider: Dr. Olivia Mackie McLean-Scocuzza-[facility]

## 2018-12-01 NOTE — Patient Instructions (Signed)
Nonalcoholic Fatty Liver Disease Diet Nonalcoholic fatty liver disease is a condition that causes fat to accumulate in and around the liver. The disease makes it harder for the liver to work the way that it should. Following a healthy diet can help to keep nonalcoholic fatty liver disease under control. It can also help to prevent or improve conditions that are associated with the disease, such as heart disease, diabetes, high blood pressure, and abnormal cholesterol levels. Along with regular exercise, this diet:  Promotes weight loss.  Helps to control blood sugar levels.  Helps to improve the way that the body uses insulin. What do I need to know about this diet?  Use the glycemic index (GI) to plan your meals. The index tells you how quickly a food will raise your blood sugar. Choose low-GI foods. These foods take a longer time to raise blood sugar.  Keep track of how many calories you take in. Eating the right amount of calories will help you to achieve a healthy weight.  You may want to follow a Mediterranean diet. This diet includes a lot of vegetables, lean meats or fish, whole grains, fruits, and healthy oils and fats. What foods can I eat? Grains Whole grains, such as whole-wheat or whole-grain breads, crackers, tortillas, cereals, and pasta. Stone-ground whole wheat. Pumpernickel bread. Unsweetened oatmeal. Bulgur. Barley. Quinoa. Brown or wild rice. Corn or whole-wheat flour tortillas. Vegetables Lettuce. Spinach. Peas. Beets. Cauliflower. Cabbage. Broccoli. Carrots. Tomatoes. Squash. Eggplant. Herbs. Peppers. Onions. Cucumbers. Brussels sprouts. Yams and sweet potatoes. Beans. Lentils. Fruits Bananas. Apples. Oranges. Grapes. Papaya. Mango. Pomegranate. Kiwi. Grapefruit. Cherries. Meats and Other Protein Sources Seafood and shellfish. Lean meats. Poultry. Tofu. Dairy Low-fat or fat-free dairy products, such as yogurt, cottage cheese, and cheese. Beverages Water. Sugar-free  drinks. Tea. Coffee. Low-fat or skim milk. Milk alternatives, such as soy or almond milk. Real fruit juice. Condiments Mustard. Relish. Low-fat, low-sugar ketchup and barbecue sauce. Low-fat or fat-free mayonnaise. Sweets and Desserts Sugar-free sweets. Fats and Oils Avocado. Canola or olive oil. Nuts and nut butters. Seeds. The items listed above may not be a complete list of recommended foods or beverages. Contact your dietitian for more options. What foods are not recommended? Palm oil and coconut oil. Processed foods. Fried foods. Sweetened drinks, such as sweet tea, milkshakes, snow cones, iced sweet drinks, and sodas. Alcohol. Sweets. Foods that contain a lot of salt or sodium. The items listed above may not be a complete list of foods and beverages to avoid. Contact your dietitian for more information. This information is not intended to replace advice given to you by your health care provider. Make sure you discuss any questions you have with your health care provider. Document Released: 01/30/2015 Document Revised: 02/21/2016 Document Reviewed: 10/10/2014 Elsevier Interactive Patient Education  2019 Else[employer] Inc[employer]  Fatty Liver Disease  Fatty liver disease occurs when too much fat has built up in your liver cells. Fatty liver disease is also called hepatic steatosis or steatohepatitis. The liver removes harmful substances from your bloodstream and produces fluids that your body needs. It also helps your body use and store energy from the food you eat. In many cases, fatty liver disease does not cause symptoms or problems. It is often diagnosed when tests are being done for other reasons. However, over time, fatty liver can cause inflammation that may lead to more serious liver problems, such as scarring of the liver (cirrhosis) and liver failure. Fatty liver is associated with insulin resistance, increased body   fat, high blood pressure (hypertension), and high cholesterol. These are  features of metabolic syndrome and increase your risk for stroke, diabetes, and heart disease. What are the causes? This condition may be caused by:  Drinking too much alcohol.  Poor nutrition.  Obesity.  Cushing's syndrome.  Diabetes.  High cholesterol.  Certain drugs.  Poisons.  Some viral infections.  Pregnancy. What increases the risk? You are more likely to develop this condition if you:  Abuse alcohol.  Are overweight.  Have diabetes.  Have hepatitis.  Have a high triglyceride level.  Are pregnant. What are the signs or symptoms? Fatty liver disease often does not cause symptoms. If symptoms do develop, they can include:  Fatigue.  Weakness.  Weight loss.  Confusion.  Abdominal pain.  Nausea and vomiting.  Yellowing of your skin and the white parts of your eyes (jaundice).  Itchy skin. How is this diagnosed? This condition may be diagnosed by:  A physical exam and medical history.  Blood tests.  Imaging tests, such as an ultrasound, CT scan, or MRI.  A liver biopsy. A small sample of liver tissue is removed using a needle. The sample is then looked at under a microscope. How is this treated? Fatty liver disease is often caused by other health conditions. Treatment for fatty liver may involve medicines and lifestyle changes to manage conditions such as:  Alcoholism.  High cholesterol.  Diabetes.  Being overweight or obese. Follow these instructions at home:   Do not drink alcohol. If you have trouble quitting, ask your health care provider how to safely quit with the help of medicine or a supervised program. This is important to keep your condition from getting worse.  Eat a healthy diet as told by your health care provider. Ask your health care provider about working with a diet and nutrition specialist (dietitian) to develop an eating plan.  Exercise regularly. This can help you lose weight and control your cholesterol and  diabetes. Talk to your health care provider about an exercise plan and which activities are best for you.  Take over-the-counter and prescription medicines only as told by your health care provider.  Keep all follow-up visits as told by your health care provider. This is important. Contact a health care provider if: You have trouble controlling your:  Blood sugar. This is especially important if you have diabetes.  Cholesterol.  Drinking of alcohol. Get help right away if:  You have abdominal pain.  You have jaundice.  You have nausea and vomiting.  You vomit blood or material that looks like coffee grounds.  You have stools that are black, tar-like, or bloody. Summary  Fatty liver disease develops when too much fat builds up in the cells of your liver.  Fatty liver disease often causes no symptoms or problems. However, over time, fatty liver can cause inflammation that may lead to more serious liver problems, such as scarring of the liver (cirrhosis).  You are more likely to develop this condition if you abuse alcohol, are pregnant, are overweight, have diabetes, have hepatitis, or have high triglyceride levels.  Contact your health care provider if you have trouble controlling your weight, blood sugar, cholesterol, or drinking of alcohol. This information is not intended to replace advice given to you by your health care provider. Make sure you discuss any questions you have with your health care provider. Document Released: 10/31/2005 Document Revised: 06/24/2017 Document Reviewed: 06/24/2017 Elsevier Interactive Patient Education  2019 Elsevier Inc.  

## 2018-12-02 LAB — HEPATITIS A ANTIBODY, TOTAL: Hepatitis A AB,Total: NONREACTIVE

## 2019-03-15 ENCOUNTER — Other Ambulatory Visit: Payer: Self-pay | Admitting: Internal Medicine

## 2019-03-15 ENCOUNTER — Telehealth: Payer: Self-pay | Admitting: Internal Medicine

## 2019-03-15 DIAGNOSIS — M1A9XX0 Chronic gout, unspecified, without tophus (tophi): Secondary | ICD-10-CM

## 2019-03-15 DIAGNOSIS — R748 Abnormal levels of other serum enzymes: Secondary | ICD-10-CM

## 2019-03-15 MED ORDER — INDOMETHACIN 50 MG PO CAPS: ORAL_CAPSULE | ORAL | 3 refills | Status: DC

## 2019-03-15 NOTE — Telephone Encounter (Signed)
Call pt to schedule non fasting lab visit to check liver enzymes again   [place]

## 2019-05-02 ENCOUNTER — Other Ambulatory Visit (INDEPENDENT_AMBULATORY_CARE_PROVIDER_SITE_OTHER): Payer: 59

## 2019-05-02 ENCOUNTER — Other Ambulatory Visit: Payer: Self-pay

## 2019-05-02 DIAGNOSIS — R748 Abnormal levels of other serum enzymes: Secondary | ICD-10-CM

## 2019-05-02 DIAGNOSIS — Z0000 Encounter for general adult medical examination without abnormal findings: Secondary | ICD-10-CM | POA: Diagnosis not present

## 2019-05-02 DIAGNOSIS — R7303 Prediabetes: Secondary | ICD-10-CM

## 2019-05-02 DIAGNOSIS — E785 Hyperlipidemia, unspecified: Secondary | ICD-10-CM

## 2019-05-02 DIAGNOSIS — Z1329 Encounter for screening for other suspected endocrine disorder: Secondary | ICD-10-CM | POA: Diagnosis not present

## 2019-05-02 DIAGNOSIS — Z1389 Encounter for screening for other disorder: Secondary | ICD-10-CM

## 2019-05-02 DIAGNOSIS — M109 Gout, unspecified: Secondary | ICD-10-CM | POA: Diagnosis not present

## 2019-05-02 DIAGNOSIS — E559 Vitamin D deficiency, unspecified: Secondary | ICD-10-CM

## 2019-05-02 DIAGNOSIS — K760 Fatty (change of) liver, not elsewhere classified: Secondary | ICD-10-CM

## 2019-05-02 LAB — LIPID PANEL
Cholesterol: 187 mg/dL (ref 0–200)
HDL: 55.1 mg/dL (ref >39.00)
LDL Cholesterol: 123 mg/dL — ABNORMAL HIGH (ref 0–99)
NonHDL: 131.49
Total CHOL/HDL Ratio: 3
Triglycerides: 43 mg/dL (ref 0.0–149.0)
VLDL: 8.6 mg/dL (ref 0.0–40.0)

## 2019-05-02 LAB — HEMOGLOBIN A1C: Hgb A1c MFr Bld: 6 % (ref 4.6–6.5)

## 2019-05-02 LAB — CBC WITH DIFFERENTIAL/PLATELET
Basophils Absolute: 0 10*3/uL (ref 0.0–0.1)
Basophils Relative: 0.4 % (ref 0.0–3.0)
Eosinophils Absolute: 0.1 10*3/uL (ref 0.0–0.7)
Eosinophils Relative: 2.8 % (ref 0.0–5.0)
HCT: 44.9 % (ref 39.0–52.0)
Hemoglobin: 14.9 g/dL (ref 13.0–17.0)
Lymphocytes Relative: 34.7 % (ref 12.0–46.0)
Lymphs Abs: 1.4 10*3/uL (ref 0.7–4.0)
MCHC: 33.1 g/dL (ref 30.0–36.0)
MCV: 92 fl (ref 78.0–100.0)
Monocytes Absolute: 0.5 10*3/uL (ref 0.1–1.0)
Monocytes Relative: 11.9 % (ref 3.0–12.0)
Neutro Abs: 2 10*3/uL (ref 1.4–7.7)
Neutrophils Relative %: 50.2 % (ref 43.0–77.0)
Platelets: 193 10*3/uL (ref 150.0–400.0)
RBC: 4.88 Mil/uL (ref 4.22–5.81)
RDW: 15.1 % (ref 11.5–15.5)
WBC: 3.9 10*3/uL — ABNORMAL LOW (ref 4.0–10.5)

## 2019-05-02 LAB — VITAMIN D 25 HYDROXY (VIT D DEFICIENCY, FRACTURES): VITD: 51.75 ng/mL (ref 30.00–100.00)

## 2019-05-02 LAB — URIC ACID: Uric Acid, Serum: 11.6 mg/dL — ABNORMAL HIGH (ref 4.0–7.8)

## 2019-05-02 LAB — TSH: TSH: 0.72 u[IU]/mL (ref 0.35–4.50)

## 2019-05-03 ENCOUNTER — Other Ambulatory Visit (INDEPENDENT_AMBULATORY_CARE_PROVIDER_SITE_OTHER): Payer: 59

## 2019-05-03 DIAGNOSIS — K760 Fatty (change of) liver, not elsewhere classified: Secondary | ICD-10-CM | POA: Diagnosis not present

## 2019-05-03 DIAGNOSIS — Z0000 Encounter for general adult medical examination without abnormal findings: Secondary | ICD-10-CM | POA: Diagnosis not present

## 2019-05-03 LAB — COMPREHENSIVE METABOLIC PANEL
ALT: 61 U/L — ABNORMAL HIGH (ref 0–53)
AST: 29 U/L (ref 0–37)
Albumin: 4.5 g/dL (ref 3.5–5.2)
Alkaline Phosphatase: 73 U/L (ref 39–117)
BUN: 15 mg/dL (ref 6–23)
CO2: 27 mEq/L (ref 19–32)
Calcium: 9.6 mg/dL (ref 8.4–10.5)
Chloride: 102 mEq/L (ref 96–112)
Creatinine, Ser: 0.92 mg/dL (ref 0.40–1.50)
GFR: 91.71 mL/min (ref >60.00)
Glucose, Bld: 109 mg/dL — ABNORMAL HIGH (ref 70–99)
Potassium: 4.3 mEq/L (ref 3.5–5.1)
Sodium: 140 mEq/L (ref 135–145)
Total Bilirubin: 0.3 mg/dL (ref 0.2–1.2)
Total Protein: 7.6 g/dL (ref 6.0–8.3)

## 2019-05-03 LAB — URINALYSIS, ROUTINE W REFLEX MICROSCOPIC
Bilirubin Urine: NEGATIVE
Glucose, UA: NEGATIVE
Hgb urine dipstick: NEGATIVE
Ketones, ur: NEGATIVE
Leukocytes,Ua: NEGATIVE
Nitrite: NEGATIVE
Protein, ur: NEGATIVE
Specific Gravity, Urine: 1.014 (ref 1.001–1.03)
pH: 8 (ref 5.0–8.0)

## 2019-05-03 NOTE — Addendum Note (Signed)
Addended by: Leeanne Rio on: 05/03/2019 10:49 AM   Modules accepted: Orders

## 2019-05-03 NOTE — Addendum Note (Signed)
Addended by: Leeanne Rio on: 05/03/2019 11:08 AM   Modules accepted: Orders

## 2019-09-06 ENCOUNTER — Telehealth: Payer: Self-pay | Admitting: Internal Medicine

## 2019-09-06 NOTE — Telephone Encounter (Signed)
Patient has gout in left foot, patient is taking his medication and watching what he eats. Patient needs an appt., nothing available.

## 2019-09-08 NOTE — Telephone Encounter (Signed)
Results for SKYELAR, GOCHANOUR (MRN [id number]) as of 09/08/2019 17:57  Ref. Range 09/21/2017 08:36 11/11/2018 09:08 05/02/2019 09:01  Uric Acid, Serum Latest Ref Range: 4.0 - 7.8 mg/dL 11.0 (H) 11.3 (H) 11.6 (H)    Will try trial of prednisone, colchichine and discuss uloric or allopurinol or probenicid going forward  Appt 09/09/19   [place]

## 2019-09-09 ENCOUNTER — Other Ambulatory Visit: Payer: Self-pay

## 2019-09-09 ENCOUNTER — Ambulatory Visit (INDEPENDENT_AMBULATORY_CARE_PROVIDER_SITE_OTHER): Payer: 59 | Admitting: Internal Medicine

## 2019-09-09 ENCOUNTER — Encounter: Payer: Self-pay | Admitting: Internal Medicine

## 2019-09-09 VITALS — Ht 71.0 in | Wt 214.0 lb

## 2019-09-09 DIAGNOSIS — M109 Gout, unspecified: Secondary | ICD-10-CM | POA: Diagnosis not present

## 2019-09-09 DIAGNOSIS — K760 Fatty (change of) liver, not elsewhere classified: Secondary | ICD-10-CM | POA: Diagnosis not present

## 2019-09-09 DIAGNOSIS — R7989 Other specified abnormal findings of blood chemistry: Secondary | ICD-10-CM | POA: Diagnosis not present

## 2019-09-09 MED ORDER — PREDNISONE 10 MG PO TABS: 10.0000 mg | ORAL_TABLET | Freq: Every day | ORAL | 0 refills | Status: DC

## 2019-09-09 MED ORDER — FEBUXOSTAT 40 MG PO TABS: 40.0000 mg | ORAL_TABLET | Freq: Every day | ORAL | 2 refills | Status: DC

## 2019-09-09 NOTE — Progress Notes (Signed)
Telephone Note  I connected with James Andrews  on 09/09/19 at  1:13 PM EST by telephone and verified that I am speaking with the correct person using two identifiers.  Location patient: car Location provider:work or home office Persons participating in the virtual visit: patient, provider  I discussed the limitations of evaluation and management by telemedicine and the availability of in person appointments. The patient expressed understanding and agreed to proceed.   HPI: 1. Left foot pain big toe and middle toe with increased size. Hard to walk w/in the last month flare last week gout flare was bad uric acid > 11. Pain last week was 120/100 tues, weds 70/100, thurs 50/100 Friday pain level 30/100 with just indocin Does not want to try allopurinol or colchicine, He is unable to walk due to gout and his left middle toe is increased in size and he has soft and hard areas on his left foot developed near left big toe and a soft area and appearance of bunion  He has been drinking beer   ROS: See pertinent positives and negatives per HPI.  Past Medical History:  Diagnosis Date  . Chicken pox   . Fatty liver   . Gout   . Seizures ([place])    Absence seizure as a child.    Past Surgical History:  Procedure Laterality Date  . FRACTURE SURGERY     Hand fracture    Family History  Problem Relation Age of Onset  . Testicular cancer Brother        dx age 44  . Cancer Brother        testicular  . Arthritis Maternal Grandmother   . Breast cancer Maternal Grandmother   . Arthritis Paternal Grandfather   . Diabetes Paternal Grandfather     SOCIAL HX:  Works with hazardous waste    Current Outpatient Medications:  .  baclofen (LIORESAL) 10 MG tablet, Take 1 tablet (10 mg total) by mouth 3 (three) times daily., Disp: 30 each, Rfl: 11 .  fluticasone (FLONASE) 50 MCG/ACT nasal spray, Place 2 sprays into both nostrils daily., Disp: 16 g, Rfl: 11 .  indomethacin (INDOCIN) 50 MG capsule, TAKE  ONE CAPSULE BY MOUTH 3 TIMES A DAY WITH FOOD **MAY STOP WHEN PAIN RESOLVED**, Disp: 90 capsule, Rfl: 3 .  ketoconazole (NIZORAL) 2 % shampoo, Apply 1 application topically 2 (two) times a week. Lather and let sit x 5 minutes, Disp: 120 mL, Rfl: 11 .  mupirocin ointment (BACTROBAN) 2 %, Apply topical to affected area daily., Disp: 22 g, Rfl: 0 .  febuxostat (ULORIC) 40 MG tablet, Take 1 tablet (40 mg total) by mouth daily., Disp: 30 tablet, Rfl: 2 .  predniSONE (DELTASONE) 10 MG tablet, Take 1 tablet (10 mg total) by mouth daily with breakfast., Disp: 14 tablet, Rfl: 0  EXAM:  VITALS per patient if applicable:  GENERAL: alert, oriented, appears well and in no acute distress  PSYCH/NEURO: pleasant and cooperative, no obvious depression or anxiety, speech and thought processing grossly intact  ASSESSMENT AND PLAN:  Discussed the following assessment and plan:  Gout, unspecified cause, unspecified chronicity, unspecified site - Plan: febuxostat (ULORIC) 40 MG tablet, predniSONE (DELTASONE) 10 MG tablet Max uroloric is 80 mg qd  Check cmet and uric acid in 4-6 weeks   HM Declines flu shot Tdap had 11/2011  Hep B immune and MMR immune Declines STD check been with wife x years married since 2013 Congratulated on quitting smoking and chew  -  former smoker quit 10 years ago smoked x 10 years 1 ppd no FH lung cancer Check fasting labs 10/2019 and f/u in 1 week  rec healthy diet and exercise  rec take D3 5000 IU daily  -we discussed possible serious and likely etiologies, options for evaluation and workup, limitations of telemedicine visit vs in person visit, treatment, treatment risks and precautions. Pt prefers to treat via telemedicine empirically rather then risking or undertaking an in person visit at this moment. Patient agrees to seek prompt in person care if worsening, new symptoms arise, or if is not improving with treatment.   I discussed the assessment and treatment plan with the  patient. The patient was provided an opportunity to ask questions and all were answered. The patient agreed with the plan and demonstrated an understanding of the instructions.   The patient was advised to call back or seek an in-person evaluation if the symptoms worsen or if the condition fails to improve as anticipated.  Time spent 20 minutes Delorise Jackson, MD

## 2019-10-05 ENCOUNTER — Other Ambulatory Visit: Payer: Self-pay | Admitting: Internal Medicine

## 2019-10-05 DIAGNOSIS — M1A9XX0 Chronic gout, unspecified, without tophus (tophi): Secondary | ICD-10-CM

## 2019-10-05 MED ORDER — INDOMETHACIN 50 MG PO CAPS: ORAL_CAPSULE | ORAL | 3 refills | Status: DC

## 2019-10-10 ENCOUNTER — Telehealth: Payer: Self-pay | Admitting: Internal Medicine

## 2019-10-10 ENCOUNTER — Other Ambulatory Visit (INDEPENDENT_AMBULATORY_CARE_PROVIDER_SITE_OTHER): Payer: 59

## 2019-10-10 ENCOUNTER — Other Ambulatory Visit: Payer: Self-pay | Admitting: Internal Medicine

## 2019-10-10 ENCOUNTER — Other Ambulatory Visit: Payer: Self-pay

## 2019-10-10 DIAGNOSIS — M109 Gout, unspecified: Secondary | ICD-10-CM

## 2019-10-10 DIAGNOSIS — R7989 Other specified abnormal findings of blood chemistry: Secondary | ICD-10-CM | POA: Diagnosis not present

## 2019-10-10 DIAGNOSIS — G8929 Other chronic pain: Secondary | ICD-10-CM

## 2019-10-10 LAB — URIC ACID: Uric Acid, Serum: 10.6 mg/dL — ABNORMAL HIGH (ref 4.0–7.8)

## 2019-10-10 LAB — COMPREHENSIVE METABOLIC PANEL
ALT: 57 U/L — ABNORMAL HIGH (ref 0–53)
AST: 30 U/L (ref 0–37)
Albumin: 4.8 g/dL (ref 3.5–5.2)
Alkaline Phosphatase: 97 U/L (ref 39–117)
BUN: 13 mg/dL (ref 6–23)
CO2: 29 mEq/L (ref 19–32)
Calcium: 9.7 mg/dL (ref 8.4–10.5)
Chloride: 103 mEq/L (ref 96–112)
Creatinine, Ser: 0.92 mg/dL (ref 0.40–1.50)
GFR: 91.5 mL/min (ref >60.00)
Glucose, Bld: 88 mg/dL (ref 70–99)
Potassium: 4 mEq/L (ref 3.5–5.1)
Sodium: 140 mEq/L (ref 135–145)
Total Bilirubin: 0.4 mg/dL (ref 0.2–1.2)
Total Protein: 8.3 g/dL (ref 6.0–8.3)

## 2019-10-10 MED ORDER — ALLOPURINOL 100 MG PO TABS: 200.0000 mg | ORAL_TABLET | Freq: Every day | ORAL | 3 refills | Status: DC

## 2019-10-10 MED ORDER — CYCLOBENZAPRINE HCL 10 MG PO TABS: 10.0000 mg | ORAL_TABLET | Freq: Every day | ORAL | 11 refills | Status: DC | PRN

## 2019-10-10 NOTE — Telephone Encounter (Signed)
Pt is taking baclofen (LIORESAL) 10 MG tablet and the cost is $200 and would like different medication that is cheaper prescribed.

## 2019-11-10 ENCOUNTER — Other Ambulatory Visit: Payer: Self-pay

## 2019-11-15 ENCOUNTER — Encounter: Payer: 59 | Admitting: Internal Medicine

## 2019-11-15 ENCOUNTER — Other Ambulatory Visit: Payer: 59

## 2019-11-21 ENCOUNTER — Other Ambulatory Visit: Payer: Self-pay

## 2019-11-22 ENCOUNTER — Encounter: Payer: Self-pay | Admitting: Internal Medicine

## 2019-11-22 ENCOUNTER — Ambulatory Visit (INDEPENDENT_AMBULATORY_CARE_PROVIDER_SITE_OTHER): Payer: 59 | Admitting: Internal Medicine

## 2019-11-22 ENCOUNTER — Other Ambulatory Visit: Payer: Self-pay

## 2019-11-22 VITALS — BP 120/84 | HR 82 | Temp 97.0°F | Ht 71.0 in | Wt 209.4 lb

## 2019-11-22 DIAGNOSIS — R2242 Localized swelling, mass and lump, left lower limb: Secondary | ICD-10-CM

## 2019-11-22 DIAGNOSIS — Z23 Encounter for immunization: Secondary | ICD-10-CM | POA: Diagnosis not present

## 2019-11-22 DIAGNOSIS — R7303 Prediabetes: Secondary | ICD-10-CM | POA: Diagnosis not present

## 2019-11-22 DIAGNOSIS — M79672 Pain in left foot: Secondary | ICD-10-CM

## 2019-11-22 DIAGNOSIS — E785 Hyperlipidemia, unspecified: Secondary | ICD-10-CM | POA: Diagnosis not present

## 2019-11-22 DIAGNOSIS — D72819 Decreased white blood cell count, unspecified: Secondary | ICD-10-CM

## 2019-11-22 DIAGNOSIS — R748 Abnormal levels of other serum enzymes: Secondary | ICD-10-CM | POA: Diagnosis not present

## 2019-11-22 DIAGNOSIS — M109 Gout, unspecified: Secondary | ICD-10-CM

## 2019-11-22 DIAGNOSIS — G8929 Other chronic pain: Secondary | ICD-10-CM

## 2019-11-22 DIAGNOSIS — E790 Hyperuricemia without signs of inflammatory arthritis and tophaceous disease: Secondary | ICD-10-CM

## 2019-11-22 DIAGNOSIS — Z0000 Encounter for general adult medical examination without abnormal findings: Secondary | ICD-10-CM

## 2019-11-22 DIAGNOSIS — M25561 Pain in right knee: Secondary | ICD-10-CM

## 2019-11-22 LAB — COMPREHENSIVE METABOLIC PANEL
ALT: 34 U/L (ref 0–53)
AST: 20 U/L (ref 0–37)
Albumin: 4.6 g/dL (ref 3.5–5.2)
Alkaline Phosphatase: 85 U/L (ref 39–117)
BUN: 18 mg/dL (ref 6–23)
CO2: 32 mEq/L (ref 19–32)
Calcium: 9.8 mg/dL (ref 8.4–10.5)
Chloride: 100 mEq/L (ref 96–112)
Creatinine, Ser: 0.94 mg/dL (ref 0.40–1.50)
GFR: 89.2 mL/min (ref >60.00)
Glucose, Bld: 106 mg/dL — ABNORMAL HIGH (ref 70–99)
Potassium: 4.1 mEq/L (ref 3.5–5.1)
Sodium: 137 mEq/L (ref 135–145)
Total Bilirubin: 0.8 mg/dL (ref 0.2–1.2)
Total Protein: 7.7 g/dL (ref 6.0–8.3)

## 2019-11-22 LAB — CBC WITH DIFFERENTIAL/PLATELET
Basophils Absolute: 0 10*3/uL (ref 0.0–0.1)
Basophils Relative: 0.4 % (ref 0.0–3.0)
Eosinophils Absolute: 0.2 10*3/uL (ref 0.0–0.7)
Eosinophils Relative: 3.5 % (ref 0.0–5.0)
HCT: 42.7 % (ref 39.0–52.0)
Hemoglobin: 14.1 g/dL (ref 13.0–17.0)
Lymphocytes Relative: 26.9 % (ref 12.0–46.0)
Lymphs Abs: 1.3 10*3/uL (ref 0.7–4.0)
MCHC: 33.1 g/dL (ref 30.0–36.0)
MCV: 91.2 fl (ref 78.0–100.0)
Monocytes Absolute: 0.5 10*3/uL (ref 0.1–1.0)
Monocytes Relative: 9.8 % (ref 3.0–12.0)
Neutro Abs: 3 10*3/uL (ref 1.4–7.7)
Neutrophils Relative %: 59.4 % (ref 43.0–77.0)
Platelets: 224 10*3/uL (ref 150.0–400.0)
RBC: 4.69 Mil/uL (ref 4.22–5.81)
RDW: 15.7 % — ABNORMAL HIGH (ref 11.5–15.5)
WBC: 5 10*3/uL (ref 4.0–10.5)

## 2019-11-22 LAB — LIPID PANEL
Cholesterol: 218 mg/dL — ABNORMAL HIGH (ref 0–200)
HDL: 61.1 mg/dL (ref >39.00)
LDL Cholesterol: 145 mg/dL — ABNORMAL HIGH (ref 0–99)
NonHDL: 156.53
Total CHOL/HDL Ratio: 4
Triglycerides: 58 mg/dL (ref 0.0–149.0)
VLDL: 11.6 mg/dL (ref 0.0–40.0)

## 2019-11-22 LAB — URIC ACID: Uric Acid, Serum: 9.9 mg/dL — ABNORMAL HIGH (ref 4.0–7.8)

## 2019-11-22 NOTE — Progress Notes (Signed)
Chief Complaint  Patient presents with  . Annual Exam   Annual  1. 09/2019 had covid 19 doing better no sx's  2. Gout left great toe and mass to left foot which is painful at times but toe was more swollen better since on allopurinol 100 mg bid and prednisone and indomethacin help  3. 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  HENT: Negative for hearing loss.   Eyes: Negative for blurred vision.  Respiratory: Negative for shortness of breath.   Cardiovascular: Negative for chest pain.  Gastrointestinal: Negative for abdominal pain.  Musculoskeletal: Negative for joint pain.  Skin: Negative for rash.  Neurological: Negative for headaches.  Psychiatric/Behavioral: Negative for depression.   Past Medical History:  Diagnosis Date  . Chicken pox   . COVID-19 virus detected    09/2019  . Fatty liver   . Gout   . Seizures ([place])    Absence seizure as a child.   Past Surgical History:  Procedure Laterality Date  . FRACTURE SURGERY     Hand fracture   Family History  Problem Relation Age of Onset  . Testicular cancer Brother        dx age 30  . Cancer Brother        testicular  . Arthritis Maternal Grandmother   . Breast cancer Maternal Grandmother   . Arthritis Paternal Grandfather   . Diabetes Paternal Grandfather   . Non-Hodgkin's lymphoma Paternal Aunt        from [place]    Social History   Socioeconomic History  . Marital status: Married    Spouse name: Not on file  . Number of children: Not on file  . Years of education: Not on file  . Highest education level: Not on file  Occupational History  . Not on file  Tobacco Use  . Smoking status: Former Smoker    Start date: 09/22/2007  . Smokeless tobacco: Former Systems developer    Types: [place] date: 06/25/2017  Substance and Sexual Activity  . Alcohol use: No  . Drug use: No  . Sexual activity: Yes    Partners: Female  Other Topics Concern  . Not on file  Social History Narrative   Works with hazardous waste    Married    Social Determinants of Health   Financial Resource Strain:   . Difficulty of Paying Living Expenses: Not on file  Food Insecurity:   . Worried About Charity fundraiser in the Last Year: Not on file  . Ran Out of Food in the Last Year: Not on file  Transportation Needs:   . Lack of Transportation (Medical): Not on file  . Lack of Transportation (Non-Medical): Not on file  Physical Activity:   . Days of Exercise per Week: Not on file  . Minutes of Exercise per Session: Not on file  Stress:   . Feeling of Stress : Not on file  Social Connections:   . Frequency of Communication with Friends and Family: Not on file  . Frequency of Social Gatherings with Friends and Family: Not on file  . Attends Religious Services: Not on file  . Active Member of Clubs or Organizations: Not on file  . Attends  Club or Organization Meetings: Not on file  . Marital Status: Not on file  Intimate Partner Violence:   . Fear of Current or Ex-Partner: Not on file  . Emotionally Abused: Not on file  . Physically Abused: Not on file  . Sexually Abused: Not on file   Current Meds  Medication Sig  . allopurinol (ZYLOPRIM) 100 MG tablet Take 2 tablets (200 mg total) by mouth daily. D/c uloric 40 mg daily  . cyclobenzaprine (FLEXERIL) 10 MG tablet Take 1 tablet (10 mg total) by mouth daily as needed for muscle spasms. Low back d/c baclofen 10 due to cost  . fluticasone (FLONASE) 50 MCG/ACT nasal spray Place 2 sprays into both nostrils daily.  . indomethacin (INDOCIN) 50 MG capsule TAKE ONE CAPSULE BY MOUTH 3 TIMES A DAY WITH FOOD **MAY STOP WHEN PAIN RESOLVED**  . ketoconazole (NIZORAL) 2 % shampoo Apply 1 application topically 2 (two) times a week. Lather and let sit  x 5 minutes  . Multiple Vitamin (MULTIVITAMIN) tablet Take 1 tablet by mouth daily.  . mupirocin ointment (BACTROBAN) 2 % Apply topical to affected area daily.   No Known Allergies Recent Results (from the past 2160 hour(s))  Uric acid     Status: Abnormal   Collection Time: 10/10/19  9:04 AM  Result Value Ref Range   Uric Acid, Serum 10.6 (H) 4.0 - 7.8 mg/dL  Comprehensive metabolic panel     Status: Abnormal   Collection Time: 10/10/19  9:04 AM  Result Value Ref Range   Sodium 140 135 - 145 mEq/L   Potassium 4.0 3.5 - 5.1 mEq/L   Chloride 103 96 - 112 mEq/L   CO2 29 19 - 32 mEq/L   Glucose, Bld 88 70 - 99 mg/dL   BUN 13 6 - 23 mg/dL   Creatinine, Ser 0.92 0.40 - 1.50 mg/dL   Total Bilirubin 0.4 0.2 - 1.2 mg/dL   Alkaline Phosphatase 97 39 - 117 U/L   AST 30 0 - 37 U/L   ALT 57 (H) 0 - 53 U/L   Total Protein 8.3 6.0 - 8.3 g/dL   Albumin 4.8 3.5 - 5.2 g/dL   GFR 91.50 >60.00 mL/min   Calcium 9.7 8.4 - 10.5 mg/dL   Objective  Body mass index is 29.21 kg/m. Wt Readings from Last 3 Encounters:  11/22/19 209 lb 6.4 oz (95 kg)  09/09/19 214 lb (97.1 kg)  12/01/18 221 lb (100.2 kg)   Temp Readings from Last 3 Encounters:  11/22/19 (!) 97 F (36.1 C) (Temporal)  12/01/18 98.3 F (36.8 C) (Oral)  11/11/18 98.6 F (37 C) (Oral)   BP Readings from Last 3 Encounters:  11/22/19 120/84  12/01/18 125/80  11/11/18 126/60   Pulse Readings from Last 3 Encounters:  11/22/19 82  12/01/18 91  11/11/18 94    Physical Exam Vitals reviewed.  Constitutional:      Appearance: Normal appearance. He is well-developed, well-groomed and overweight.  HENT:     Head: Normocephalic and atraumatic.     Ears:     Comments: Mild wax in ears  Eyes:     Conjunctiva/sclera: Conjunctivae normal.     Pupils: Pupils are equal, round, and reactive to light.  Cardiovascular:     Rate and Rhythm: Normal rate and regular rhythm.     Heart sounds: Normal heart sounds. No murmur.  Pulmonary:       Effort: Pulmonary effort is normal.     Breath sounds: Normal  breath sounds.  Musculoskeletal:       Legs:  Skin:    General: Skin is warm and dry.  Neurological:     General: No focal deficit present.     Mental Status: He is alert and oriented to person, place, and time. Mental status is at baseline.     Gait: Gait normal.  Psychiatric:        Attention and Perception: Attention and perception normal.        Mood and Affect: Mood and affect normal.        Speech: Speech normal.        Behavior: Behavior normal. Behavior is cooperative.        Thought Content: Thought content normal.        Cognition and Memory: Cognition and memory normal.        Judgment: Judgment normal.     Assessment  Plan  Annual physical exam Declines flu shot Tdap had 11/2011 Hep A vaccine 1/2 today repeat in 6-12 months last dose Hep B immune and MMR immune Declines STD check been with wife x years married since 2013 Congratulated on quitting smoking and chew  -former smoker quit 10 years ago smoked x 10 years 1 ppd no FH lung cancer Check fasting labs2/2021 today  rec healthy diet and exercise rec take D3 5000 IU daily  Acute gout of left foot, unspecified cause - Plan: Comprehensive metabolic panel, CBC with Differential/Platelet, Uric acid, Ambulatory referral to Orthopedic Surgery  Prediabetes - Plan: Hemoglobin A1c  Elevated blood uric acid level - Plan: Uric acid  Elevated liver enzymes - Plan: Comprehensive metabolic panel  Hyperlipidemia, unspecified hyperlipidemia type - Plan: Lipid panel  Leukopenia, unspecified type - Plan: CBC with Differential/Platelet  Chronic pain of right knee - Plan: Ambulatory referral to Orthopedic Surgery Left foot pain - Plan: Ambulatory referral to Orthopedic Surgery Foot mass, left - Plan: Ambulatory referral to Orthopedic Surgery Dr.Miller or Harlow Mares  Provider: Dr. Olivia Mackie McLean-Scocuzza-[facility] Medicine

## 2019-11-22 NOTE — Patient Instructions (Signed)
2nd dose hep A vaccine due in 6-12 months  Hepatitis A Vaccine, Inactivated suspension for injection What is this medicine? HEPATITIS A VACCINE (hep uh TAHY tis A VAK seen) is a vaccine to protect from an infection with the hepatitis A virus. This vaccine does not contain the live virus. It will not cause a hepatitis infection. This vaccine is also used with immunoglobulin to prevent infection in people who have been exposed to hepatitis A. This medicine may be used for other purposes; ask your health care provider or pharmacist if you have questions. COMMON BRAND NAME(S): Havrix, Vaqta What should I tell my health care provider before I take this medicine? They need to know if you have any of these conditions:  bleeding disorder  fever or infection  heart disease  immune system problems  an unusual or allergic reaction to hepatitis A vaccine, latex, neomycin, other medicines, foods, dyes, or preservatives  pregnant or trying to get pregnant  breast-feeding How should I use this medicine? This vaccine is for injection into a muscle. It is given by a health care professional. A copy of Vaccine Information Statements will be given before each vaccination. Read this sheet carefully each time. The sheet may change frequently. Talk to your pediatrician regarding the use of this medicine in children. While this drug may be prescribed for children as young as 85 months of age for selected conditions, precautions do apply. Overdosage: If you think you have taken too much of this medicine contact a poison control center or emergency room at once. NOTE: This medicine is only for you. Do not share this medicine with others. What if I miss a dose? This does not apply. What may interact with this medicine?  medicines to treat cancer  medicines that suppress your immune function like adalimumab, anakinra, etanercept, infliximab  steroid medicines like prednisone or cortisone This list may not  describe all possible interactions. Give your health care provider a list of all the medicines, herbs, non-prescription drugs, or dietary supplements you use. Also tell them if you smoke, drink alcohol, or use illegal drugs. Some items may interact with your medicine. What should I watch for while using this medicine? See your health care provider for a booster shot of this vaccine as directed. Tell your doctor right away if you have any serious or unusual side effects after getting this vaccine. You will not have protection from the hepatitis A virus for at least 8 to 10 days after your first injection. The length of time you will have protection from hepatitis A virus infection is not known. Check with your doctor if you have questions about your immunity. See your doctor before you travel out of the country. What side effects may I notice from receiving this medicine? Side effects that you should report to your doctor or health care professional as soon as possible:  allergic reactions like skin rash, itching or hives, swelling of the face, lips, or tongue  breathing problems  seizures  yellowing of the eyes or skin Side effects that usually do not require medical attention (report to your doctor or health care professional if they continue or are bothersome):  diarrhea  fever  loss of appetite  muscle pain  nausea  pain, redness, swelling or irritation at site where injected  tiredness This list may not describe all possible side effects. Call your doctor for medical advice about side effects. You may report side effects to FDA at 1-800-FDA-1088. Where should I  keep my medicine? This drug is given in a hospital or clinic and will not be stored at home. NOTE: This sheet is a summary. It may not cover all possible information. If you have questions about this medicine, talk to your doctor, pharmacist, or health care provider.  2020 [employer]vier/Gold Standard (2014-01-16 13:19:40)

## 2019-11-24 LAB — HEMOGLOBIN A1C: Hgb A1c MFr Bld: 6.4 % (ref 4.6–6.5)

## 2019-11-25 ENCOUNTER — Other Ambulatory Visit: Payer: Self-pay | Admitting: Internal Medicine

## 2019-11-25 DIAGNOSIS — M109 Gout, unspecified: Secondary | ICD-10-CM

## 2019-11-25 MED ORDER — ALLOPURINOL 300 MG PO TABS: 300.0000 mg | ORAL_TABLET | Freq: Every day | ORAL | 3 refills | Status: DC

## 2019-12-14 ENCOUNTER — Ambulatory Visit: Payer: 59 | Admitting: Internal Medicine

## 2020-05-16 ENCOUNTER — Ambulatory Visit: Payer: 59 | Admitting: Internal Medicine

## 2020-05-17 ENCOUNTER — Ambulatory Visit: Payer: 59 | Admitting: Internal Medicine

## 2020-05-17 ENCOUNTER — Other Ambulatory Visit: Payer: Self-pay

## 2020-05-17 VITALS — BP 130/102 | HR 98 | Temp 98.6°F | Ht 70.98 in | Wt 215.0 lb

## 2020-05-17 DIAGNOSIS — E669 Obesity, unspecified: Secondary | ICD-10-CM | POA: Diagnosis not present

## 2020-05-17 DIAGNOSIS — Z1389 Encounter for screening for other disorder: Secondary | ICD-10-CM

## 2020-05-17 DIAGNOSIS — M109 Gout, unspecified: Secondary | ICD-10-CM | POA: Diagnosis not present

## 2020-05-17 DIAGNOSIS — K760 Fatty (change of) liver, not elsewhere classified: Secondary | ICD-10-CM

## 2020-05-17 DIAGNOSIS — Z1329 Encounter for screening for other suspected endocrine disorder: Secondary | ICD-10-CM

## 2020-05-17 DIAGNOSIS — Z23 Encounter for immunization: Secondary | ICD-10-CM

## 2020-05-17 DIAGNOSIS — R030 Elevated blood-pressure reading, without diagnosis of hypertension: Secondary | ICD-10-CM

## 2020-05-17 DIAGNOSIS — Z1283 Encounter for screening for malignant neoplasm of skin: Secondary | ICD-10-CM

## 2020-05-17 DIAGNOSIS — R748 Abnormal levels of other serum enzymes: Secondary | ICD-10-CM

## 2020-05-17 DIAGNOSIS — L989 Disorder of the skin and subcutaneous tissue, unspecified: Secondary | ICD-10-CM

## 2020-05-17 DIAGNOSIS — Z13818 Encounter for screening for other digestive system disorders: Secondary | ICD-10-CM

## 2020-05-17 DIAGNOSIS — R7303 Prediabetes: Secondary | ICD-10-CM

## 2020-05-17 DIAGNOSIS — R000 Tachycardia, unspecified: Secondary | ICD-10-CM

## 2020-05-17 DIAGNOSIS — E785 Hyperlipidemia, unspecified: Secondary | ICD-10-CM

## 2020-05-17 NOTE — Addendum Note (Signed)
Addended by: Ezequiel Ganser on: 05/17/2020 03:30 PM   Modules accepted: Orders

## 2020-05-17 NOTE — Patient Instructions (Addendum)
Monitor Blood pressure goal <130/<80  Goal heart rate 60-100  Last hep A vaccine given today   Hypertension, Adult High blood pressure (hypertension) is when the force of blood pumping through the arteries is too strong. The arteries are the blood vessels that carry blood from the heart throughout the body. Hypertension forces the heart to work harder to pump blood and may cause arteries to become narrow or stiff. Untreated or uncontrolled hypertension can cause a heart attack, heart failure, a stroke, kidney disease, and other problems. A blood pressure reading consists of a higher number over a lower number. Ideally, your blood pressure should be below 120/80. The first ("top") number is called the systolic pressure. It is a measure of the pressure in your arteries as your heart beats. The second ("bottom") number is called the diastolic pressure. It is a measure of the pressure in your arteries as the heart relaxes. What are the causes? The exact cause of this condition is not known. There are some conditions that result in or are related to high blood pressure. What increases the risk? Some risk factors for high blood pressure are under your control. The following factors may make you more likely to develop this condition:  Smoking.  Having type 2 diabetes mellitus, high cholesterol, or both.  Not getting enough exercise or physical activity.  Being overweight.  Having too much fat, sugar, calories, or salt (sodium) in your diet.  Drinking too much alcohol. Some risk factors for high blood pressure may be difficult or impossible to change. Some of these factors include:  Having chronic kidney disease.  Having a family history of high blood pressure.  Age. Risk increases with age.  Race. You may be at higher risk if you are African American.  Gender. Men are at higher risk than women before age 52. After age 23, women are at higher risk than men.  Having obstructive sleep  apnea.  Stress. What are the signs or symptoms? High blood pressure may not cause symptoms. Very high blood pressure (hypertensive crisis) may cause:  Headache.  Anxiety.  Shortness of breath.  Nosebleed.  Nausea and vomiting.  Vision changes.  Severe chest pain.  Seizures. How is this diagnosed? This condition is diagnosed by measuring your blood pressure while you are seated, with your arm resting on a flat surface, your legs uncrossed, and your feet flat on the floor. The cuff of the blood pressure monitor will be placed directly against the skin of your upper arm at the level of your heart. It should be measured at least twice using the same arm. Certain conditions can cause a difference in blood pressure between your right and left arms. Certain factors can cause blood pressure readings to be lower or higher than normal for a short period of time:  When your blood pressure is higher when you are in a health care provider's office than when you are at home, this is called white coat hypertension. Most people with this condition do not need medicines.  When your blood pressure is higher at home than when you are in a health care provider's office, this is called masked hypertension. Most people with this condition may need medicines to control blood pressure. If you have a high blood pressure reading during one visit or you have normal blood pressure with other risk factors, you may be asked to:  Return on a different day to have your blood pressure checked again.  Monitor your blood pressure  at home for 1 week or longer. If you are diagnosed with hypertension, you may have other blood or imaging tests to help your health care provider understand your overall risk for other conditions. How is this treated? This condition is treated by making healthy lifestyle changes, such as eating healthy foods, exercising more, and reducing your alcohol intake. Your health care provider may  prescribe medicine if lifestyle changes are not enough to get your blood pressure under control, and if:  Your systolic blood pressure is above 130.  Your diastolic blood pressure is above 80. Your personal target blood pressure may vary depending on your medical conditions, your age, and other factors. Follow these instructions at home: Eating and drinking   Eat a diet that is high in fiber and potassium, and low in sodium, added sugar, and fat. An example eating plan is called the DASH (Dietary Approaches to Stop Hypertension) diet. To eat this way: ? Eat plenty of fresh fruits and vegetables. Try to fill one half of your plate at each meal with fruits and vegetables. ? Eat whole grains, such as whole-wheat pasta, brown rice, or whole-grain bread. Fill about one fourth of your plate with whole grains. ? Eat or drink low-fat dairy products, such as skim milk or low-fat yogurt. ? Avoid fatty cuts of meat, processed or cured meats, and poultry with skin. Fill about one fourth of your plate with lean proteins, such as fish, chicken without skin, beans, eggs, or tofu. ? Avoid pre-made and processed foods. These tend to be higher in sodium, added sugar, and fat.  Reduce your daily sodium intake. Most people with hypertension should eat less than 1,500 mg of sodium a day.  Do not drink alcohol if: ? Your health care provider tells you not to drink. ? You are pregnant, may be pregnant, or are planning to become pregnant.  If you drink alcohol: ? Limit how much you use to:  0-1 drink a day for women.  0-2 drinks a day for men. ? Be aware of how much alcohol is in your drink. In the [place] one drink equals one 12 oz bottle of beer (355 mL), one 5 oz glass of wine (148 mL), or one 1 oz glass of hard liquor (44 mL). Lifestyle   Work with your health care provider to maintain a healthy body weight or to lose weight. Ask what an ideal weight is for you.  Get at least 30 minutes of exercise  most days of the week. Activities may include walking, swimming, or biking.  Include exercise to strengthen your muscles (resistance exercise), such as Pilates or lifting weights, as part of your weekly exercise routine. Try to do these types of exercises for 30 minutes at least 3 days a week.  Do not use any products that contain nicotine or tobacco, such as cigarettes, e-cigarettes, and chewing tobacco. If you need help quitting, ask your health care provider.  Monitor your blood pressure at home as told by your health care provider.  Keep all follow-up visits as told by your health care provider. This is important. Medicines  Take over-the-counter and prescription medicines only as told by your health care provider. Follow directions carefully. Blood pressure medicines must be taken as prescribed.  Do not skip doses of blood pressure medicine. Doing this puts you at risk for problems and can make the medicine less effective.  Ask your health care provider about side effects or reactions to medicines that you should watch  for. Contact a health care provider if you:  Think you are having a reaction to a medicine you are taking.  Have headaches that keep coming back (recurring).  Feel dizzy.  Have swelling in your ankles.  Have trouble with your vision. Get help right away if you:  Develop a severe headache or confusion.  Have unusual weakness or numbness.  Feel faint.  Have severe pain in your chest or abdomen.  Vomit repeatedly.  Have trouble breathing. Summary  Hypertension is when the force of blood pumping through your arteries is too strong. If this condition is not controlled, it may put you at risk for serious complications.  Your personal target blood pressure may vary depending on your medical conditions, your age, and other factors. For most people, a normal blood pressure is less than 120/80.  Hypertension is treated with lifestyle changes, medicines, or a  combination of both. Lifestyle changes include losing weight, eating a healthy, low-sodium diet, exercising more, and limiting alcohol. This information is not intended to replace advice given to you by your health care provider. Make sure you discuss any questions you have with your health care provider. Document Revised: 05/26/2018 Document Reviewed: 05/26/2018 Elsevier Patient Education  [place] DASH stands for "Dietary Approaches to Stop Hypertension." The DASH eating plan is a healthy eating plan that has been shown to reduce high blood pressure (hypertension). It may also reduce your risk for type 2 diabetes, heart disease, and stroke. The DASH eating plan may also help with weight loss. What are tips for following this plan?  General guidelines  Avoid eating more than 2,300 mg (milligrams) of salt (sodium) a day. If you have hypertension, you may need to reduce your sodium intake to 1,500 mg a day.  Limit alcohol intake to no more than 1 drink a day for nonpregnant women and 2 drinks a day for men. One drink equals 12 oz of beer, 5 oz of wine, or 1 oz of hard liquor.  Work with your health care provider to maintain a healthy body weight or to lose weight. Ask what an ideal weight is for you.  Get at least 30 minutes of exercise that causes your heart to beat faster (aerobic exercise) most days of the week. Activities may include walking, swimming, or biking.  Work with your health care provider or diet and nutrition specialist (dietitian) to adjust your eating plan to your individual calorie needs. Reading food labels   Check food labels for the amount of sodium per serving. Choose foods with less than 5 percent of the Daily Value of sodium. Generally, foods with less than 300 mg of sodium per serving fit into this eating plan.  To find whole grains, look for the word "whole" as the first word in the ingredient list. Shopping  Buy products labeled as  "low-sodium" or "no salt added."  Buy fresh foods. Avoid canned foods and premade or frozen meals. Cooking  Avoid adding salt when cooking. Use salt-free seasonings or herbs instead of table salt or sea salt. Check with your health care provider or pharmacist before using salt substitutes.  Do not fry foods. Cook foods using healthy methods such as baking, boiling, grilling, and broiling instead.  Cook with heart-healthy oils, such as olive, canola, soybean, or sunflower oil. Meal planning  Eat a balanced diet that includes: ? 5 or more servings of fruits and vegetables each day. At each meal, try to fill half of  your plate with fruits and vegetables. ? Up to 6-8 servings of whole grains each day. ? Less than 6 oz of lean meat, poultry, or fish each day. A 3-oz serving of meat is about the same size as a deck of cards. One egg equals 1 oz. ? 2 servings of low-fat dairy each day. ? A serving of nuts, seeds, or beans 5 times each week. ? Heart-healthy fats. Healthy fats called Omega-3 fatty acids are found in foods such as flaxseeds and coldwater fish, like sardines, salmon, and mackerel.  Limit how much you eat of the following: ? Canned or prepackaged foods. ? Food that is high in trans fat, such as fried foods. ? Food that is high in saturated fat, such as fatty meat. ? Sweets, desserts, sugary drinks, and other foods with added sugar. ? Full-fat dairy products.  Do not salt foods before eating.  Try to eat at least 2 vegetarian meals each week.  Eat more home-cooked food and less restaurant, buffet, and fast food.  When eating at a restaurant, ask that your food be prepared with less salt or no salt, if possible. What foods are recommended? The items listed may not be a complete list. Talk with your dietitian about what dietary choices are best for you. Grains Whole-grain or whole-wheat bread. Whole-grain or whole-wheat pasta. Brown rice. [place]. Bulgur. Whole-grain  and low-sodium cereals. Pita bread. Low-fat, low-sodium crackers. Whole-wheat flour tortillas. Vegetables Fresh or frozen vegetables (raw, steamed, roasted, or grilled). Low-sodium or reduced-sodium tomato and vegetable juice. Low-sodium or reduced-sodium tomato sauce and tomato paste. Low-sodium or reduced-sodium canned vegetables. Fruits All fresh, dried, or frozen fruit. Canned fruit in natural juice (without added sugar). Meat and other protein foods Skinless chicken or [place]. Ground chicken or [place]. Pork with fat trimmed off. Fish and seafood. Egg whites. Dried beans, peas, or lentils. Unsalted nuts, nut butters, and seeds. Unsalted canned beans. Lean cuts of beef with fat trimmed off. Low-sodium, lean deli meat. Dairy Low-fat (1%) or fat-free (skim) milk. Fat-free, low-fat, or reduced-fat cheeses. Nonfat, low-sodium ricotta or cottage cheese. Low-fat or nonfat yogurt. Low-fat, low-sodium cheese. Fats and oils Soft margarine without trans fats. Vegetable oil. Low-fat, reduced-fat, or light mayonnaise and salad dressings (reduced-sodium). Canola, safflower, olive, soybean, and sunflower oils. Avocado. Seasoning and other foods Herbs. Spices. Seasoning mixes without salt. Unsalted popcorn and pretzels. Fat-free sweets. What foods are not recommended? The items listed may not be a complete list. Talk with your dietitian about what dietary choices are best for you. Grains Baked goods made with fat, such as croissants, muffins, or some breads. Dry pasta or rice meal packs. Vegetables Creamed or fried vegetables. Vegetables in a cheese sauce. Regular canned vegetables (not low-sodium or reduced-sodium). Regular canned tomato sauce and paste (not low-sodium or reduced-sodium). Regular tomato and vegetable juice (not low-sodium or reduced-sodium). Angie Fava. Olives. Fruits Canned fruit in a light or heavy syrup. Fried fruit. Fruit in cream or butter sauce. Meat and other protein foods Fatty cuts  of meat. Ribs. Fried meat. Berniece Salines. Sausage. Bologna and other processed lunch meats. Salami. Fatback. Hotdogs. Bratwurst. Salted nuts and seeds. Canned beans with added salt. Canned or smoked fish. Whole eggs or egg yolks. Chicken or [place] with skin. Dairy Whole or 2% milk, cream, and half-and-half. Whole or full-fat cream cheese. Whole-fat or sweetened yogurt. Full-fat cheese. Nondairy creamers. Whipped toppings. Processed cheese and cheese spreads. Fats and oils Butter. Stick margarine. Lard. Shortening. Ghee. Bacon fat. Tropical oils, such  as coconut, palm kernel, or palm oil. Seasoning and other foods Salted popcorn and pretzels. Onion salt, garlic salt, seasoned salt, table salt, and sea salt. Worcestershire sauce. Tartar sauce. Barbecue sauce. Teriyaki sauce. Soy sauce, including reduced-sodium. Steak sauce. Canned and packaged gravies. Fish sauce. Oyster sauce. Cocktail sauce. Horseradish that you find on the shelf. Ketchup. Mustard. Meat flavorings and tenderizers. Bouillon cubes. Hot sauce and Tabasco sauce. Premade or packaged marinades. Premade or packaged taco seasonings. Relishes. Regular salad dressings. Where to find more information:  National Heart, Lung, and [place]: https://wilson-eaton.com/  American Heart Association: www.heart.org Summary  The DASH eating plan is a healthy eating plan that has been shown to reduce high blood pressure (hypertension). It may also reduce your risk for type 2 diabetes, heart disease, and stroke.  With the DASH eating plan, you should limit salt (sodium) intake to 2,300 mg a day. If you have hypertension, you may need to reduce your sodium intake to 1,500 mg a day.  When on the DASH eating plan, aim to eat more fresh fruits and vegetables, whole grains, lean proteins, low-fat dairy, and heart-healthy fats.  Work with your health care provider or diet and nutrition specialist (dietitian) to adjust your eating plan to your individual calorie  needs. This information is not intended to replace advice given to you by your health care provider. Make sure you discuss any questions you have with your health care provider. Document Revised: 08/28/2017 Document Reviewed: 09/08/2016 Elsevier Patient Education  2020 [place].    Prediabetes Eating Plan Prediabetes is a condition that causes blood sugar (glucose) levels to be higher than normal. This increases the risk for developing diabetes. In order to prevent diabetes from developing, your health care provider may recommend a diet and other lifestyle changes to help you:  Control your blood glucose levels.  Improve your cholesterol levels.  Manage your blood pressure. Your health care provider may recommend working with a diet and nutrition specialist (dietitian) to make a meal plan that is best for you. What are tips for following this plan? Lifestyle  Set weight loss goals with the help of your health care team. It is recommended that most people with prediabetes lose 7% of their current body weight.  Exercise for at least 30 minutes at least 5 days a week.  Attend a support group or seek ongoing support from a mental health counselor.  Take over-the-counter and prescription medicines only as told by your health care provider. Reading food labels  Read food labels to check the amount of fat, salt (sodium), and sugar in prepackaged foods. Avoid foods that have: ? Saturated fats. ? Trans fats. ? Added sugars.  Avoid foods that have more than 300 milligrams (mg) of sodium per serving. Limit your daily sodium intake to less than 2,300 mg each day. Shopping  Avoid buying pre-made and processed foods. Cooking  Cook with olive oil. Do not use butter, lard, or ghee.  Bake, broil, grill, or boil foods. Avoid frying. Meal planning   Work with your dietitian to develop an eating plan that is right for you. This may include: ? Tracking how many calories you take in. Use  a food diary, notebook, or mobile application to track what you eat at each meal. ? Using the glycemic index (GI) to plan your meals. The index tells you how quickly a food will raise your blood glucose. Choose low-GI foods. These foods take a longer time to raise blood glucose.  Consider  following a Mediterranean diet. This diet includes: ? Several servings each day of fresh fruits and vegetables. ? Eating fish at least twice a week. ? Several servings each day of whole grains, beans, nuts, and seeds. ? Using olive oil instead of other fats. ? Moderate alcohol consumption. ? Eating small amounts of red meat and whole-fat dairy.  If you have high blood pressure, you may need to limit your sodium intake or follow a diet such as the DASH eating plan. DASH is an eating plan that aims to lower high blood pressure. What foods are recommended? The items listed below may not be a complete list. Talk with your dietitian about what dietary choices are best for you. Grains Whole grains, such as whole-wheat or whole-grain breads, crackers, cereals, and pasta. Unsweetened oatmeal. Bulgur. Barley. Quinoa. Brown rice. Corn or whole-wheat flour tortillas or taco shells. Vegetables Lettuce. Spinach. Peas. Beets. Cauliflower. Cabbage. Broccoli. Carrots. Tomatoes. Squash. Eggplant. Herbs. Peppers. Onions. Cucumbers. Brussels sprouts. Fruits Berries. Bananas. Apples. Oranges. Grapes. Papaya. Mango. Pomegranate. Kiwi. Grapefruit. Cherries. Meats and other protein foods Seafood. Poultry without skin. Lean cuts of pork and beef. Tofu. Eggs. Nuts. Beans. Dairy Low-fat or fat-free dairy products, such as yogurt, cottage cheese, and cheese. Beverages Water. Tea. Coffee. Sugar-free or diet soda. Seltzer water. Lowfat or no-fat milk. Milk alternatives, such as soy or almond milk. Fats and oils Olive oil. Canola oil. Sunflower oil. Grapeseed oil. Avocado. Walnuts. Sweets and desserts Sugar-free or low-fat pudding.  Sugar-free or low-fat ice cream and other frozen treats. Seasoning and other foods Herbs. Sodium-free spices. Mustard. Relish. Low-fat, low-sugar ketchup. Low-fat, low-sugar barbecue sauce. Low-fat or fat-free mayonnaise. What foods are not recommended? The items listed below may not be a complete list. Talk with your dietitian about what dietary choices are best for you. Grains Refined white flour and flour products, such as bread, pasta, snack foods, and cereals. Vegetables Canned vegetables. Frozen vegetables with butter or cream sauce. Fruits Fruits canned with syrup. Meats and other protein foods Fatty cuts of meat. Poultry with skin. Breaded or fried meat. Processed meats. Dairy Full-fat yogurt, cheese, or milk. Beverages Sweetened drinks, such as sweet iced tea and soda. Fats and oils Butter. Lard. Ghee. Sweets and desserts Baked goods, such as cake, cupcakes, pastries, cookies, and cheesecake. Seasoning and other foods Spice mixes with added salt. Ketchup. Barbecue sauce. Mayonnaise. Summary  To prevent diabetes from developing, you may need to make diet and other lifestyle changes to help control blood sugar, improve cholesterol levels, and manage your blood pressure.  Set weight loss goals with the help of your health care team. It is recommended that most people with prediabetes lose 7 percent of their current body weight.  Consider following a Mediterranean diet that includes plenty of fresh fruits and vegetables, whole grains, beans, nuts, seeds, fish, lean meat, low-fat dairy, and healthy oils. This information is not intended to replace advice given to you by your health care provider. Make sure you discuss any questions you have with your health care provider. Document Revised: 01/07/2019 Document Reviewed: 11/19/2016 Elsevier Patient Education  2020 [employer].

## 2020-05-17 NOTE — Progress Notes (Signed)
Chief Complaint  Patient presents with  . Hepatitis A    Pt came for 2nd Hep A Vaccine   F/u  1. BP and HR elevated today increased stress, coffee had steak wrap and pickle will monitor at home and let me know if continues elevated  Leaving for [place] today and will go few days  2. Gout controlled on allopurinol 300 mg qd rarely taking indomethicin    Review of Systems  Constitutional: Negative for weight loss.  HENT: Negative for hearing loss.   Eyes: Negative for blurred vision.  Respiratory: Negative for shortness of breath.   Cardiovascular: Negative for chest pain.  Gastrointestinal: Negative for abdominal pain.  Musculoskeletal: Negative for falls.  Skin: Negative for rash.  Neurological: Negative for dizziness and headaches.  Psychiatric/Behavioral: Negative for depression.   Past Medical History:  Diagnosis Date  . Chicken pox   . COVID-19 virus detected    09/2019  . Fatty liver   . Gout   . Seizures ([place])    Absence seizure as a child.   Past Surgical History:  Procedure Laterality Date  . FRACTURE SURGERY     Hand fracture   Family History  Problem Relation Age of Onset  . Testicular cancer Brother        dx age 13  . Cancer Brother        testicular  . Arthritis Maternal Grandmother   . Breast cancer Maternal Grandmother   . Arthritis Paternal Grandfather   . Diabetes Paternal Grandfather   . Non-Hodgkin's lymphoma Paternal Aunt        from round up    Social History   Socioeconomic History  . Marital status: Married    Spouse name: Not on file  . Number of children: Not on file  . Years of education: Not on file  . Highest education level: Not on file  Occupational History  . Not on file  Tobacco Use  . Smoking status: Former Smoker    Start date: 09/22/2007  . Smokeless tobacco: Former Systems developer    Types: [place] date: 06/25/2017  Substance and Sexual Activity  . Alcohol use: No  . Drug use: No  . Sexual activity: Yes     Partners: Female  Other Topics Concern  . Not on file  Social History Narrative   Works with hazardous waste    Married    Social Determinants of Health   Financial Resource Strain:   . Difficulty of Paying Living Expenses: Not on file  Food Insecurity:   . Worried About Charity fundraiser in the Last Year: Not on file  . Ran Out of Food in the Last Year: Not on file  Transportation Needs:   . Lack of Transportation (Medical): Not on file  . Lack of Transportation (Non-Medical): Not on file  Physical Activity:   . Days of Exercise per Week: Not on file  . Minutes of Exercise per Session: Not on file  Stress:   . Feeling of Stress : Not on file  Social Connections:   . Frequency of Communication with Friends and Family: Not on file  . Frequency of Social Gatherings with Friends and Family: Not on file  . Attends Religious Services: Not on file  . Active Member of Clubs or Organizations: Not on file  . Attends Archivist Meetings: Not on file  . Marital Status: Not on file  Intimate Partner Violence:   . Fear  of Current or Ex-Partner: Not on file  . Emotionally Abused: Not on file  . Physically Abused: Not on file  . Sexually Abused: Not on file   Current Meds  Medication Sig  . allopurinol (ZYLOPRIM) 300 MG tablet Take 1 tablet (300 mg total) by mouth daily. D/c uloric 40 mg daily d/c 200 mg daily  . cyclobenzaprine (FLEXERIL) 10 MG tablet Take 1 tablet (10 mg total) by mouth daily as needed for muscle spasms. Low back d/c baclofen 10 due to cost  . fluticasone (FLONASE) 50 MCG/ACT nasal spray Place 2 sprays into both nostrils daily.  . indomethacin (INDOCIN) 50 MG capsule TAKE ONE CAPSULE BY MOUTH 3 TIMES A DAY WITH FOOD **MAY STOP WHEN PAIN RESOLVED**  . ketoconazole (NIZORAL) 2 % shampoo Apply 1 application topically 2 (two) times a week. Lather and let sit x 5 minutes  . Multiple Vitamin (MULTIVITAMIN) tablet Take 1 tablet by mouth daily.  . mupirocin ointment  (BACTROBAN) 2 % Apply topical to affected area daily.   No Known Allergies No results found for this or any previous visit (from the past 2160 hour(s)). Objective  Body mass index is 30 kg/m. Wt Readings from Last 3 Encounters:  05/17/20 215 lb (97.5 kg)  11/22/19 209 lb 6.4 oz (95 kg)  09/09/19 214 lb (97.1 kg)   Temp Readings from Last 3 Encounters:  05/17/20 98.6 F (37 C)  11/22/19 (!) 97 F (36.1 C) (Temporal)  12/01/18 98.3 F (36.8 C) (Oral)   BP Readings from Last 3 Encounters:  05/17/20 (!) 130/102  11/22/19 120/84  12/01/18 125/80   Pulse Readings from Last 3 Encounters:  05/17/20 98  11/22/19 82  12/01/18 91    Physical Exam Vitals and nursing note reviewed.  Constitutional:      Appearance: Normal appearance. He is well-developed and well-groomed. He is obese.  HENT:     Head: Normocephalic and atraumatic.  Eyes:     Conjunctiva/sclera: Conjunctivae normal.     Pupils: Pupils are equal, round, and reactive to light.  Cardiovascular:     Rate and Rhythm: Normal rate and regular rhythm.     Heart sounds: Normal heart sounds. No murmur heard.   Pulmonary:     Effort: Pulmonary effort is normal.     Breath sounds: Normal breath sounds.  Skin:    General: Skin is warm and dry.  Neurological:     General: No focal deficit present.     Mental Status: He is alert and oriented to person, place, and time. Mental status is at baseline.     Gait: Gait normal.  Psychiatric:        Attention and Perception: Attention and perception normal.        Mood and Affect: Mood and affect normal.        Speech: Speech normal.        Behavior: Behavior normal. Behavior is cooperative.        Thought Content: Thought content normal.        Cognition and Memory: Cognition normal.        Judgment: Judgment normal.     Assessment  Plan  Fatty liver - Plan: Hepatitis C antibody Last hep A shot today  Obesity (BMI 30-39.9) rec healthy diet and exercise given list  for preDM Prediabetes - Plan: Hemoglobin A1c  Gout, unspecified cause, unspecified chronicity, unspecified site - Plan: Uric acid Doing better on allopurinol   Elevated liver enzymes - Plan: Comprehensive  metabolic panel  Elevated blood pressure reading - Plan: Comprehensive metabolic panel, Lipid panel, CBC with Differential/Platelet  Sinus tachycardia -monitor HR and BP goal <130/<80   Skin cancer screening - Plan: Ambulatory referral to Dermatology  Skin lesion - Plan: Ambulatory referral to Dermatology  Encounter for screening for other digestive system disorders - Plan: Hepatitis C antibody  Hyperlipidemia, unspecified hyperlipidemia type - Plan: Lipid panel  Thyroid disorder screen - Plan: TSH  Screening for blood or protein in urine - Plan: Urinalysis, Routine w reflex microscopic   HM Declines flu shot Tdap had 11/2011  moderna 2/2 [facility] 3rd dose spring 2022   Hep A vaccine 2/2 today last dose Hep B immune and MMR immune Declines STD check been with wife x years married since 2013 Congratulated on quitting smoking and chew  -former smoker quit 10 years ago smoked x 10 years 1 ppd no FH lung cancer Check fasting labs2/2021 today  rec healthy diet and exercise rec take D3 5000 IU daily   Provider: Dr. Olivia Mackie McLean-Scocuzza-Internal Medicine

## 2020-05-18 NOTE — Progress Notes (Signed)
Hep A Vaccine has been logged in [place].

## 2020-06-12 ENCOUNTER — Ambulatory Visit: Payer: 59

## 2020-07-04 ENCOUNTER — Ambulatory Visit: Payer: 59

## 2020-07-19 ENCOUNTER — Ambulatory Visit (INDEPENDENT_AMBULATORY_CARE_PROVIDER_SITE_OTHER): Payer: 59

## 2020-07-19 ENCOUNTER — Other Ambulatory Visit: Payer: Self-pay

## 2020-07-19 DIAGNOSIS — R030 Elevated blood-pressure reading, without diagnosis of hypertension: Secondary | ICD-10-CM | POA: Diagnosis not present

## 2020-07-19 DIAGNOSIS — Z13818 Encounter for screening for other digestive system disorders: Secondary | ICD-10-CM

## 2020-07-19 DIAGNOSIS — R7303 Prediabetes: Secondary | ICD-10-CM | POA: Diagnosis not present

## 2020-07-19 DIAGNOSIS — E785 Hyperlipidemia, unspecified: Secondary | ICD-10-CM

## 2020-07-19 DIAGNOSIS — M109 Gout, unspecified: Secondary | ICD-10-CM | POA: Diagnosis not present

## 2020-07-19 DIAGNOSIS — Z1329 Encounter for screening for other suspected endocrine disorder: Secondary | ICD-10-CM

## 2020-07-19 DIAGNOSIS — R748 Abnormal levels of other serum enzymes: Secondary | ICD-10-CM | POA: Diagnosis not present

## 2020-07-19 DIAGNOSIS — Z1389 Encounter for screening for other disorder: Secondary | ICD-10-CM

## 2020-07-19 DIAGNOSIS — K760 Fatty (change of) liver, not elsewhere classified: Secondary | ICD-10-CM

## 2020-07-19 LAB — CBC WITH DIFFERENTIAL/PLATELET
Basophils Absolute: 0 10*3/uL (ref 0.0–0.1)
Basophils Relative: 0.5 % (ref 0.0–3.0)
Eosinophils Absolute: 0.2 10*3/uL (ref 0.0–0.7)
Eosinophils Relative: 5 % (ref 0.0–5.0)
HCT: 42.5 % (ref 39.0–52.0)
Hemoglobin: 14.3 g/dL (ref 13.0–17.0)
Lymphocytes Relative: 27.2 % (ref 12.0–46.0)
Lymphs Abs: 1 10*3/uL (ref 0.7–4.0)
MCHC: 33.6 g/dL (ref 30.0–36.0)
MCV: 92 fl (ref 78.0–100.0)
Monocytes Absolute: 0.4 10*3/uL (ref 0.1–1.0)
Monocytes Relative: 11.1 % (ref 3.0–12.0)
Neutro Abs: 2.1 10*3/uL (ref 1.4–7.7)
Neutrophils Relative %: 56.2 % (ref 43.0–77.0)
Platelets: 230 10*3/uL (ref 150.0–400.0)
RBC: 4.62 Mil/uL (ref 4.22–5.81)
RDW: 14.7 % (ref 11.5–15.5)
WBC: 3.7 10*3/uL — ABNORMAL LOW (ref 4.0–10.5)

## 2020-07-19 LAB — LIPID PANEL
Cholesterol: 188 mg/dL (ref 0–200)
HDL: 66 mg/dL (ref >39.00)
LDL Cholesterol: 114 mg/dL — ABNORMAL HIGH (ref 0–99)
NonHDL: 121.52
Total CHOL/HDL Ratio: 3
Triglycerides: 36 mg/dL (ref 0.0–149.0)
VLDL: 7.2 mg/dL (ref 0.0–40.0)

## 2020-07-19 LAB — HEMOGLOBIN A1C: Hgb A1c MFr Bld: 6.1 % (ref 4.6–6.5)

## 2020-07-19 LAB — TSH: TSH: 1.53 u[IU]/mL (ref 0.35–4.50)

## 2020-07-19 LAB — URIC ACID: Uric Acid, Serum: 8.8 mg/dL — ABNORMAL HIGH (ref 4.0–7.8)

## 2020-07-19 NOTE — Progress Notes (Signed)
Patient is here for a BP check due to bp being high at last visit, as per patient.  Currently patients BP is 138/82 and BPM is 80.  Patient has no complaints of headaches, blurry vision, chest pain, arm pain, light headedness, dizziness, and nor jaw pain. Please see previous note for order.

## 2020-07-19 NOTE — Addendum Note (Signed)
Addended by: Tor Netters I on: 07/19/2020 09:09 AM   Modules accepted: Orders

## 2020-07-20 LAB — COMPREHENSIVE METABOLIC PANEL
ALT: 68 U/L — ABNORMAL HIGH (ref 0–53)
AST: 40 U/L — ABNORMAL HIGH (ref 0–37)
Albumin: 4.6 g/dL (ref 3.5–5.2)
Alkaline Phosphatase: 74 U/L (ref 39–117)
BUN: 15 mg/dL (ref 6–23)
CO2: 31 mEq/L (ref 19–32)
Calcium: 9.6 mg/dL (ref 8.4–10.5)
Chloride: 101 mEq/L (ref 96–112)
Creatinine, Ser: 0.92 mg/dL (ref 0.40–1.50)
GFR: 109 mL/min (ref >60.00)
Glucose, Bld: 91 mg/dL (ref 70–99)
Potassium: 4.4 mEq/L (ref 3.5–5.1)
Sodium: 139 mEq/L (ref 135–145)
Total Bilirubin: 0.8 mg/dL (ref 0.2–1.2)
Total Protein: 7.4 g/dL (ref 6.0–8.3)

## 2020-07-20 LAB — URINALYSIS, ROUTINE W REFLEX MICROSCOPIC
Bilirubin Urine: NEGATIVE
Glucose, UA: NEGATIVE
Hgb urine dipstick: NEGATIVE
Ketones, ur: NEGATIVE
Leukocytes,Ua: NEGATIVE
Nitrite: NEGATIVE
Protein, ur: NEGATIVE
Specific Gravity, Urine: 1.014 (ref 1.001–1.03)
pH: 7 (ref 5.0–8.0)

## 2020-07-23 LAB — HEPATITIS C ANTIBODY
Hepatitis C Ab: NONREACTIVE
SIGNAL TO CUT-OFF: 0.01 (ref <1.00)

## 2020-07-26 ENCOUNTER — Telehealth: Payer: Self-pay | Admitting: Internal Medicine

## 2020-07-26 NOTE — Telephone Encounter (Signed)
BP elevated slightly and continues to be >130/>80 goal is less   Is he agreeable to try low dose losartan 25 mg daily?   Write down BP daily 2 hours after medication and bring in BP log in 2 weeks to the clinic to drop off   If he does not have rec buy automatic BP cuff [employer]  Happy Early Birthday   Dr. Gayland Curry

## 2020-07-30 NOTE — Telephone Encounter (Signed)
Patient informed and verbalized understanding.  He declines medication at this time. States he has a cuff at home and his BP is normal then.   Patient will check BP every morning and write this down for 1-2 weeks and then bring in/send [place] his log.

## 2020-07-31 NOTE — Telephone Encounter (Signed)
Noted declines BP med

## 2020-09-03 ENCOUNTER — Ambulatory Visit: Payer: 59 | Admitting: Dermatology

## 2020-09-11 ENCOUNTER — Telehealth: Payer: Self-pay | Admitting: Internal Medicine

## 2020-09-11 NOTE — Telephone Encounter (Signed)
Rejection Reason - Patient was No Show" [facility] said on Sep 11, 2020 8:51 AM

## 2020-10-16 ENCOUNTER — Telehealth: Payer: Self-pay | Admitting: Internal Medicine

## 2020-10-16 DIAGNOSIS — J309 Allergic rhinitis, unspecified: Secondary | ICD-10-CM

## 2020-10-16 DIAGNOSIS — M109 Gout, unspecified: Secondary | ICD-10-CM

## 2020-10-16 DIAGNOSIS — G8929 Other chronic pain: Secondary | ICD-10-CM

## 2020-10-16 DIAGNOSIS — B360 Pityriasis versicolor: Secondary | ICD-10-CM

## 2020-10-16 MED ORDER — FLUTICASONE PROPIONATE 50 MCG/ACT NA SUSP: 2.0000 | Freq: Every day | NASAL | 11 refills | Status: DC

## 2020-10-16 MED ORDER — KETOCONAZOLE 2 % EX SHAM: 1.0000 "application " | MEDICATED_SHAMPOO | CUTANEOUS | 11 refills | Status: DC

## 2020-10-16 MED ORDER — ALLOPURINOL 300 MG PO TABS: 300.0000 mg | ORAL_TABLET | Freq: Every day | ORAL | 3 refills | Status: DC

## 2020-10-16 MED ORDER — CYCLOBENZAPRINE HCL 10 MG PO TABS: 10.0000 mg | ORAL_TABLET | Freq: Every day | ORAL | 11 refills | Status: DC | PRN

## 2020-10-16 NOTE — Telephone Encounter (Signed)
Patient requested that all his medication be sent to [facility] on [address] in [place] from this time forward.

## 2020-10-23 ENCOUNTER — Telehealth: Payer: Self-pay

## 2020-10-23 ENCOUNTER — Other Ambulatory Visit: Payer: Self-pay | Admitting: Internal Medicine

## 2020-10-23 DIAGNOSIS — M1A9XX0 Chronic gout, unspecified, without tophus (tophi): Secondary | ICD-10-CM

## 2020-10-23 MED ORDER — INDOMETHACIN 50 MG PO CAPS: ORAL_CAPSULE | ORAL | 3 refills | Status: DC

## 2020-10-23 NOTE — Telephone Encounter (Signed)
He would like this TODAY.Marland KitchenPt needs indomethacin (INDOCIN) 50 MG capsule sent to [facility] on [address]-He was unable to get the one sent to [facility] due to insurance

## 2020-11-23 ENCOUNTER — Encounter: Payer: Self-pay | Admitting: Internal Medicine

## 2020-11-23 ENCOUNTER — Ambulatory Visit (INDEPENDENT_AMBULATORY_CARE_PROVIDER_SITE_OTHER): Payer: 59 | Admitting: Internal Medicine

## 2020-11-23 ENCOUNTER — Other Ambulatory Visit: Payer: Self-pay

## 2020-11-23 VITALS — BP 142/94 | HR 115 | Temp 98.1°F | Ht 71.65 in | Wt 214.4 lb

## 2020-11-23 DIAGNOSIS — Z0184 Encounter for antibody response examination: Secondary | ICD-10-CM

## 2020-11-23 DIAGNOSIS — M109 Gout, unspecified: Secondary | ICD-10-CM | POA: Diagnosis not present

## 2020-11-23 DIAGNOSIS — R7303 Prediabetes: Secondary | ICD-10-CM | POA: Diagnosis not present

## 2020-11-23 DIAGNOSIS — R030 Elevated blood-pressure reading, without diagnosis of hypertension: Secondary | ICD-10-CM | POA: Diagnosis not present

## 2020-11-23 DIAGNOSIS — Z0000 Encounter for general adult medical examination without abnormal findings: Secondary | ICD-10-CM

## 2020-11-23 DIAGNOSIS — E559 Vitamin D deficiency, unspecified: Secondary | ICD-10-CM | POA: Diagnosis not present

## 2020-11-23 LAB — CBC WITH DIFFERENTIAL/PLATELET
Basophils Absolute: 0 10*3/uL (ref 0.0–0.1)
Basophils Relative: 0.7 % (ref 0.0–3.0)
Eosinophils Absolute: 0.2 10*3/uL (ref 0.0–0.7)
Eosinophils Relative: 4.8 % (ref 0.0–5.0)
HCT: 46.1 % (ref 39.0–52.0)
Hemoglobin: 15.5 g/dL (ref 13.0–17.0)
Lymphocytes Relative: 29.4 % (ref 12.0–46.0)
Lymphs Abs: 1.1 10*3/uL (ref 0.7–4.0)
MCHC: 33.5 g/dL (ref 30.0–36.0)
MCV: 92.6 fl (ref 78.0–100.0)
Monocytes Absolute: 0.3 10*3/uL (ref 0.1–1.0)
Monocytes Relative: 8.9 % (ref 3.0–12.0)
Neutro Abs: 2 10*3/uL (ref 1.4–7.7)
Neutrophils Relative %: 56.2 % (ref 43.0–77.0)
Platelets: 226 10*3/uL (ref 150.0–400.0)
RBC: 4.98 Mil/uL (ref 4.22–5.81)
RDW: 15.3 % (ref 11.5–15.5)
WBC: 3.6 10*3/uL — ABNORMAL LOW (ref 4.0–10.5)

## 2020-11-23 LAB — LIPID PANEL
Cholesterol: 201 mg/dL — ABNORMAL HIGH (ref 0–200)
HDL: 66.4 mg/dL (ref >39.00)
LDL Cholesterol: 126 mg/dL — ABNORMAL HIGH (ref 0–99)
NonHDL: 134.67
Total CHOL/HDL Ratio: 3
Triglycerides: 42 mg/dL (ref 0.0–149.0)
VLDL: 8.4 mg/dL (ref 0.0–40.0)

## 2020-11-23 LAB — COMPREHENSIVE METABOLIC PANEL
ALT: 56 U/L — ABNORMAL HIGH (ref 0–53)
AST: 39 U/L — ABNORMAL HIGH (ref 0–37)
Albumin: 4.8 g/dL (ref 3.5–5.2)
Alkaline Phosphatase: 75 U/L (ref 39–117)
BUN: 10 mg/dL (ref 6–23)
CO2: 29 mEq/L (ref 19–32)
Calcium: 9.6 mg/dL (ref 8.4–10.5)
Chloride: 103 mEq/L (ref 96–112)
Creatinine, Ser: 0.91 mg/dL (ref 0.40–1.50)
GFR: 105.57 mL/min (ref >60.00)
Glucose, Bld: 83 mg/dL (ref 70–99)
Potassium: 4.5 mEq/L (ref 3.5–5.1)
Sodium: 142 mEq/L (ref 135–145)
Total Bilirubin: 0.4 mg/dL (ref 0.2–1.2)
Total Protein: 8 g/dL (ref 6.0–8.3)

## 2020-11-23 LAB — URIC ACID: Uric Acid, Serum: 6.5 mg/dL (ref 4.0–7.8)

## 2020-11-23 LAB — VITAMIN D 25 HYDROXY (VIT D DEFICIENCY, FRACTURES): VITD: 29.85 ng/mL — ABNORMAL LOW (ref 30.00–100.00)

## 2020-11-23 LAB — HEMOGLOBIN A1C: Hgb A1c MFr Bld: 5.8 % (ref 4.6–6.5)

## 2020-11-23 NOTE — Progress Notes (Signed)
Chief Complaint  Patient presents with  . Annual Exam   Annual  1. BP elevated today will check at home for 2 weeks and let me know  2 gout controlled on allopurinol 300 mg qhs and prn indocine 50 mg qd  3. Declines covid 19 booster    Review of Systems  Constitutional: Negative for weight loss.  HENT: Negative for hearing loss.   Eyes: Negative for blurred vision.  Respiratory: Negative for shortness of breath.   Cardiovascular: Negative for chest pain.  Gastrointestinal: Negative for blood in stool.  Musculoskeletal: Negative for falls and joint pain.  Skin: Negative for rash.  Neurological: Negative for headaches.  Psychiatric/Behavioral:       +work stress   Past Medical History:  Diagnosis Date  . Chicken pox   . COVID-19 virus detected    09/2019  . Fatty liver   . Gout   . Seizures ([place])    Absence seizure as a child.   Past Surgical History:  Procedure Laterality Date  . FRACTURE SURGERY     Hand fracture   Family History  Problem Relation Age of Onset  . Testicular cancer Brother        dx age 15  . Cancer Brother        testicular  . Arthritis Maternal Grandmother   . Breast cancer Maternal Grandmother   . Arthritis Paternal Grandfather   . Diabetes Paternal Grandfather   . Non-Hodgkin's lymphoma Paternal Aunt        from round up    Social History   Socioeconomic History  . Marital status: Married    Spouse name: Not on file  . Number of children: Not on file  . Years of education: Not on file  . Highest education level: Not on file  Occupational History  . Not on file  Tobacco Use  . Smoking status: Former Smoker    Start date: 09/22/2007  . Smokeless tobacco: Former Systems developer    Types: [place] date: 06/25/2017  Substance and Sexual Activity  . Alcohol use: No  . Drug use: No  . Sexual activity: Yes    Partners: Female  Other Topics Concern  . Not on file  Social History Narrative   Works with hazardous waste    Married    Social  Determinants of Radio broadcast assistant Strain: Not on file  Food Insecurity: Not on file  Transportation Needs: Not on file  Physical Activity: Not on file  Stress: Not on file  Social Connections: Not on file  Intimate Partner Violence: Not on file   Current Meds  Medication Sig  . allopurinol (ZYLOPRIM) 300 MG tablet Take 1 tablet (300 mg total) by mouth daily. D/c uloric 40 mg daily d/c 200 mg daily  . Black Pepper-Turmeric (TURMERIC COMPLEX/BLACK PEPPER PO) Take by mouth.  . cyclobenzaprine (FLEXERIL) 10 MG tablet Take 1 tablet (10 mg total) by mouth daily as needed for muscle spasms. Low back d/c baclofen 10 due to cost  . fluticasone (FLONASE) 50 MCG/ACT nasal spray Place 2 sprays into both nostrils daily.  . indomethacin (INDOCIN) 50 MG capsule TAKE ONE CAPSULE BY MOUTH 3 TIMES A DAY WITH FOOD PRN GOUT FLARE **MAY STOP WHEN PAIN RESOLVED**  . Multiple Vitamin (MULTIVITAMIN) tablet Take 1 tablet by mouth daily.   No Known Allergies No results found for this or any previous visit (from the past 2160 hour(s)). Objective  Body mass index is  29.36 kg/m. Wt Readings from Last 3 Encounters:  11/23/20 214 lb 6.4 oz (97.3 kg)  05/17/20 215 lb (97.5 kg)  11/22/19 209 lb 6.4 oz (95 kg)   Temp Readings from Last 3 Encounters:  11/23/20 98.1 F (36.7 C) (Oral)  05/17/20 98.6 F (37 C)  11/22/19 (!) 97 F (36.1 C) (Temporal)   BP Readings from Last 3 Encounters:  11/23/20 (!) 142/94  07/19/20 138/82  05/17/20 (!) 130/102   Pulse Readings from Last 3 Encounters:  11/23/20 (!) 115  07/19/20 80  05/17/20 98    Physical Exam Vitals and nursing note reviewed.  Constitutional:      Appearance: Normal appearance. He is well-developed, well-groomed and overweight.  HENT:     Head: Normocephalic and atraumatic.  Eyes:     Conjunctiva/sclera: Conjunctivae normal.     Pupils: Pupils are equal, round, and reactive to light.  Cardiovascular:     Rate and Rhythm: Normal rate  and regular rhythm.     Heart sounds: Normal heart sounds.  Pulmonary:     Effort: Pulmonary effort is normal.     Breath sounds: Normal breath sounds.  Abdominal:     General: Abdomen is flat. Bowel sounds are normal.     Tenderness: There is no abdominal tenderness.  Skin:    General: Skin is warm and dry.  Neurological:     General: No focal deficit present.     Mental Status: He is alert and oriented to person, place, and time. Mental status is at baseline.     Gait: Gait normal.  Psychiatric:        Attention and Perception: Attention and perception normal.        Mood and Affect: Mood and affect normal.        Speech: Speech normal.        Behavior: Behavior normal. Behavior is cooperative.        Thought Content: Thought content normal.        Cognition and Memory: Cognition and memory normal.        Judgment: Judgment normal.     Assessment  Plan  Annual physical exam - Plan: Comprehensive metabolic panel, Lipid panel, CBC with Differential/Platelet, Uric acid, Hemoglobin A1c, Vitamin D (25 hydroxy), Hepatitis A Ab, Total Declines flu shot Tdap had 11/2011  moderna 2/2 [facility] declines booster had covid +  Hep A vaccine 2/2 today last dose Hep B immune and MMR immune Declines STD check been with wife x years married since 2013 Congratulated on quitting smoking and chew  -former smoker quit 10 years ago smoked x 10 years 1 ppd no FH lung cancer Check fasting today rec healthy diet and exercise rec take D3 5000 IU daily   Elevated blood pressure reading Monitor and let me know in 2 weeks   Gout, unspecified cause, unspecified chronicity, unspecified site - Plan: Uric acid Allopurinol 300 mg qd prn indocine 50   Prediabetes - Plan: Hemoglobin A1c Healthy diet and exercise rec    Provider: Dr. Tracy McLean-Scocuzza-Internal Medicine  

## 2020-11-23 NOTE — Patient Instructions (Addendum)
Consider losartan 25 in the future   Prediabetes Eating Plan Prediabetes is a condition that causes blood sugar (glucose) levels to be higher than normal. This increases the risk for developing type 2 diabetes (type 2 diabetes mellitus). Working with a health care provider or nutrition specialist (dietitian) to make diet and lifestyle changes can help prevent the onset of diabetes. These changes may help you:  Control your blood glucose levels.  Improve your cholesterol levels.  Manage your blood pressure. What are tips for following this plan? Reading food labels  Read food labels to check the amount of fat, salt (sodium), and sugar in prepackaged foods. Avoid foods that have: ? Saturated fats. ? Trans fats. ? Added sugars.  Avoid foods that have more than 300 milligrams (mg) of sodium per serving. Limit your sodium intake to less than 2,300 mg each day. Shopping  Avoid buying pre-made and processed foods.  Avoid buying drinks with added sugar. Cooking  Cook with olive oil. Do not use butter, lard, or ghee.  Bake, broil, grill, steam, or boil foods. Avoid frying. Meal planning  Work with your dietitian to create an eating plan that is right for you. This may include tracking how many calories you take in each day. Use a food diary, notebook, or mobile application to track what you eat at each meal.  Consider following a Mediterranean diet. This includes: ? Eating several servings of fresh fruits and vegetables each day. ? Eating fish at least twice a week. ? Eating one serving each day of whole grains, beans, nuts, and seeds. ? Using olive oil instead of other fats. ? Limiting alcohol. ? Limiting red meat. ? Using nonfat or low-fat dairy products.  Consider following a plant-based diet. This includes dietary choices that focus on eating mostly vegetables and fruit, grains, beans, nuts, and seeds.  If you have high blood pressure, you may need to limit your sodium intake  or follow a diet such as the DASH (Dietary Approaches to Stop Hypertension) eating plan. The DASH diet aims to lower high blood pressure.   Lifestyle  Set weight loss goals with help from your health care team. It is recommended that most people with prediabetes lose 7% of their body weight.  Exercise for at least 30 minutes 5 or more days a week.  Attend a support group or seek support from a mental health counselor.  Take over-the-counter and prescription medicines only as told by your health care provider. What foods are recommended? Fruits Berries. Bananas. Apples. Oranges. Grapes. Papaya. Mango. Pomegranate. Kiwi. Grapefruit. Cherries. Vegetables Lettuce. Spinach. Peas. Beets. Cauliflower. Cabbage. Broccoli. Carrots. Tomatoes. Squash. Eggplant. Herbs. Peppers. Onions. Cucumbers. Brussels sprouts. Grains Whole grains, such as whole-wheat or whole-grain breads, crackers, cereals, and pasta. Unsweetened oatmeal. Bulgur. Barley. Quinoa. Brown rice. Corn or whole-wheat flour tortillas or taco shells. Meats and other proteins Seafood. Poultry without skin. Lean cuts of pork and beef. Tofu. Eggs. Nuts. Beans. Dairy Low-fat or fat-free dairy products, such as yogurt, cottage cheese, and cheese. Beverages Water. Tea. Coffee. Sugar-free or diet soda. Seltzer water. Low-fat or nonfat milk. Milk alternatives, such as soy or almond milk. Fats and oils Olive oil. Canola oil. Sunflower oil. Grapeseed oil. Avocado. Walnuts. Sweets and desserts Sugar-free or low-fat pudding. Sugar-free or low-fat ice cream and other frozen treats. Seasonings and condiments Herbs. Sodium-free spices. Mustard. Relish. Low-salt, low-sugar ketchup. Low-salt, low-sugar barbecue sauce. Low-fat or fat-free mayonnaise. The items listed above may not be a complete list of recommended  foods and beverages. Contact a dietitian for more information. What foods are not recommended? Fruits Fruits canned with  syrup. Vegetables Canned vegetables. Frozen vegetables with butter or cream sauce. Grains Refined white flour and flour products, such as bread, pasta, snack foods, and cereals. Meats and other proteins Fatty cuts of meat. Poultry with skin. Breaded or fried meat. Processed meats. Dairy Full-fat yogurt, cheese, or milk. Beverages Sweetened drinks, such as iced tea and soda. Fats and oils Butter. Lard. Ghee. Sweets and desserts Baked goods, such as cake, cupcakes, pastries, cookies, and cheesecake. Seasonings and condiments Spice mixes with added salt. Ketchup. Barbecue sauce. Mayonnaise. The items listed above may not be a complete list of foods and beverages that are not recommended. Contact a dietitian for more information. Where to find more information  American Diabetes Association: www.diabetes.org Summary  You may need to make diet and lifestyle changes to help prevent the onset of diabetes. These changes can help you control blood sugar, improve cholesterol levels, and manage blood pressure.  Set weight loss goals with help from your health care team. It is recommended that most people with prediabetes lose 7% of their body weight.  Consider following a Mediterranean diet. This includes eating plenty of fresh fruits and vegetables, whole grains, beans, nuts, seeds, fish, and low-fat dairy, and using olive oil instead of other fats. This information is not intended to replace advice given to you by your health care provider. Make sure you discuss any questions you have with your health care provider. Document Revised: 12/15/2019 Document Reviewed: 12/15/2019 Elsevier Patient Education  2021 [employer] with less salt is one way to reduce the amount of sodium you get from food. Sodium is one of the elements that make up salt. It is found naturally in foods and is also added to certain foods. Depending on your condition and overall health, your  health care provider or dietitian may recommend that you reduce your sodium intake. Most people should have less than 2,300 milligrams (mg) of sodium each day. If you have high blood pressure (hypertension), you may need to limit your sodium to 1,500 mg each day. Follow the tips below to help reduce your sodium intake. What are tips for eating less sodium? Reading food labels  Check the food label before buying or using packaged ingredients. Always check the label for the serving size and sodium content.  Look for products with no more than 140 mg of sodium in one serving.  Check the % Daily Value column to see what percent of the daily recommended amount of sodium is provided in one serving of the product. Foods with 5% or less in this column are considered low in sodium. Foods with 20% or higher are considered high in sodium.  Do not choose foods with salt as one of the first three ingredients on the ingredients list. If salt is one of the first three ingredients, it usually means the item is high in sodium.   Shopping  Buy sodium-free or low-sodium products. Look for the following words on food labels: ? Low-sodium. ? Sodium-free. ? Reduced-sodium. ? No salt added. ? Unsalted.  Always check the sodium content even if foods are labeled as low-sodium or no salt added.  Buy fresh foods. Cooking  Use herbs, seasonings without salt, and spices as substitutes for salt.  Use sodium-free baking soda when baking.  Grill, braise, or roast foods to add flavor with less salt.  Avoid  adding salt to pasta, rice, or hot cereals.  Drain and rinse canned vegetables, beans, and meat before use.  Avoid adding salt when cooking sweets and desserts.  Cook with low-sodium ingredients. What foods are high in sodium? Vegetables Regular canned vegetables (not low-sodium or reduced-sodium). Sauerkraut, pickled vegetables, and relishes. Olives. [place] fries. Onion rings. Regular canned tomato sauce  and paste. Regular tomato and vegetable juice. Frozen vegetables in sauces. Grains Instant hot cereals. Bread stuffing, pancake, and biscuit mixes. Croutons. Seasoned rice or pasta mixes. Noodle soup cups. Boxed or frozen macaroni and cheese. Regular salted crackers. Self-rising flour. Rolls. Bagels. Flour tortillas and wraps. Meats and other proteins Meat or fish that is salted, canned, smoked, cured, spiced, or pickled. This includes bacon, ham, sausages, hot dogs, corned beef, chipped beef, meat loaves, salt pork, jerky, pickled herring, anchovies, regular canned tuna, and sardines. Salted nuts. Dairy Processed cheese and cheese spreads. Cheese curds. Blue cheese. Feta cheese. String cheese. Regular cottage cheese. Buttermilk. Canned milk. The items listed above may not be a complete list of foods high in sodium. Actual amounts of sodium may be different depending on processing. Contact a dietitian for more information. What foods are low in sodium? Fruits Fresh, frozen, or canned fruit with no sauce added. Fruit juice. Vegetables Fresh or frozen vegetables with no sauce added. "No salt added" canned vegetables. "No salt added" tomato sauce and paste. Low-sodium or reduced-sodium tomato and vegetable juice. Grains Noodles, pasta, quinoa, rice. Shredded or puffed wheat or puffed rice. Regular or quick oats (not instant). Low-sodium crackers. Low-sodium bread. Whole-grain bread and whole-grain pasta. Unsalted popcorn. Meats and other proteins Fresh or frozen whole meats, poultry (not injected with sodium), and fish with no sauce added. Unsalted nuts. Dried peas, beans, and lentils without added salt. Unsalted canned beans. Eggs. Unsalted nut butters. Low-sodium canned tuna or chicken. Dairy Milk. Soy milk. Yogurt. Low-sodium cheeses, such as Swiss, Monte[place] Jack, [place], and [employer]. Sherbet or ice cream (keep to  cup per serving). Cream cheese. Fats and oils Unsalted butter or  margarine. Other foods Homemade pudding. Sodium-free baking soda and baking powder. Herbs and spices. Low-sodium seasoning mixes. Beverages Coffee and tea. Carbonated beverages. The items listed above may not be a complete list of foods low in sodium. Actual amounts of sodium may be different depending on processing. Contact a dietitian for more information. What are some salt alternatives when cooking? The following are herbs, seasonings, and spices that can be used instead of salt to flavor your food. Herbs should be fresh or dried. Do not choose packaged mixes. Next to the name of the herb, spice, or seasoning are some examples of foods you can pair it with. Herbs  Bay leaves - Soups, meat and vegetable dishes, and spaghetti sauce.  Basil - [employer], soups, pasta, and fish dishes.  Cilantro - Meat, poultry, and vegetable dishes.  Chili powder - Marinades and Mexican dishes.  Chives - Salad dressings and potato dishes.  Cumin - Mexican dishes, couscous, and meat dishes.  Dill - Fish dishes, sauces, and salads.  Fennel - Meat and vegetable dishes, breads, and cookies.  Garlic (do not use garlic salt) - [place] dishes, meat dishes, salad dressings, and sauces.  Marjoram - Soups, potato dishes, and meat dishes.  Oregano - Pizza and spaghetti sauce.  Parsley - Salads, soups, pasta, and meat dishes.  Rosemary - [place] dishes, salad dressings, soups, and red meats.  Saffron - Fish dishes, pasta, and some poultry dishes.  Sage - Stuffings and sauces.  Tarragon - Fish and [employer].  Thyme - Stuffing, meat, and fish dishes. Seasonings  Lemon juice - Fish dishes, poultry dishes, vegetables, and salads.  Vinegar - Salad dressings, vegetables, and fish dishes. Spices  Cinnamon - Sweet dishes, such as cakes, cookies, and puddings.  Cloves - Gingerbread, puddings, and marinades for meats.  Curry - Vegetable dishes, fish and poultry dishes, and stir-fry  dishes.  Ginger - Vegetable dishes, fish dishes, and stir-fry dishes.  Nutmeg - Pasta, vegetables, poultry, fish dishes, and custard. Summary  Cooking with less salt is one way to reduce the amount of sodium that you get from food.  Buy sodium-free or low-sodium products.  Check the food label before using or buying packaged ingredients.  Use herbs, seasonings without salt, and spices as substitutes for salt in foods. This information is not intended to replace advice given to you by your health care provider. Make sure you discuss any questions you have with your health care provider. Document Revised: 09/07/2019 Document Reviewed: 09/07/2019 Elsevier Patient Education  2021 [place] Eating Plan DASH stands for Dietary Approaches to Stop Hypertension. The DASH eating plan is a healthy eating plan that has been shown to:  Reduce high blood pressure (hypertension).  Reduce your risk for type 2 diabetes, heart disease, and stroke.  Help with weight loss. What are tips for following this plan? Reading food labels  Check food labels for the amount of salt (sodium) per serving. Choose foods with less than 5 percent of the Daily Value of sodium. Generally, foods with less than 300 milligrams (mg) of sodium per serving fit into this eating plan.  To find whole grains, look for the word "whole" as the first word in the ingredient list. Shopping  Buy products labeled as "low-sodium" or "no salt added."  Buy fresh foods. Avoid canned foods and pre-made or frozen meals. Cooking  Avoid adding salt when cooking. Use salt-free seasonings or herbs instead of table salt or sea salt. Check with your health care provider or pharmacist before using salt substitutes.  Do not fry foods. Cook foods using healthy methods such as baking, boiling, grilling, roasting, and broiling instead.  Cook with heart-healthy oils, such as olive, canola, avocado, soybean, or sunflower oil. Meal  planning  Eat a balanced diet that includes: ? 4 or more servings of fruits and 4 or more servings of vegetables each day. Try to fill one-half of your plate with fruits and vegetables. ? 6-8 servings of whole grains each day. ? Less than 6 oz (170 g) of lean meat, poultry, or fish each day. A 3-oz (85-g) serving of meat is about the same size as a deck of cards. One egg equals 1 oz (28 g). ? 2-3 servings of low-fat dairy each day. One serving is 1 cup (237 mL). ? 1 serving of nuts, seeds, or beans 5 times each week. ? 2-3 servings of heart-healthy fats. Healthy fats called omega-3 fatty acids are found in foods such as walnuts, flaxseeds, fortified milks, and eggs. These fats are also found in cold-water fish, such as sardines, salmon, and mackerel.  Limit how much you eat of: ? Canned or prepackaged foods. ? Food that is high in trans fat, such as some fried foods. ? Food that is high in saturated fat, such as fatty meat. ? Desserts and other sweets, sugary drinks, and other foods with added sugar. ? Full-fat dairy products.  Do not salt  foods before eating.  Do not eat more than 4 egg yolks a week.  Try to eat at least 2 vegetarian meals a week.  Eat more home-cooked food and less restaurant, buffet, and fast food.   Lifestyle  When eating at a restaurant, ask that your food be prepared with less salt or no salt, if possible.  If you drink alcohol: ? Limit how much you use to:  0-1 drink a day for women who are not pregnant.  0-2 drinks a day for men. ? Be aware of how much alcohol is in your drink. In the [place] one drink equals one 12 oz bottle of beer (355 mL), one 5 oz glass of wine (148 mL), or one 1 oz glass of hard liquor (44 mL). General information  Avoid eating more than 2,300 mg of salt a day. If you have hypertension, you may need to reduce your sodium intake to 1,500 mg a day.  Work with your health care provider to maintain a healthy body weight or to lose  weight. Ask what an ideal weight is for you.  Get at least 30 minutes of exercise that causes your heart to beat faster (aerobic exercise) most days of the week. Activities may include walking, swimming, or biking.  Work with your health care provider or dietitian to adjust your eating plan to your individual calorie needs. What foods should I eat? Fruits All fresh, dried, or frozen fruit. Canned fruit in natural juice (without added sugar). Vegetables Fresh or frozen vegetables (raw, steamed, roasted, or grilled). Low-sodium or reduced-sodium tomato and vegetable juice. Low-sodium or reduced-sodium tomato sauce and tomato paste. Low-sodium or reduced-sodium canned vegetables. Grains Whole-grain or whole-wheat bread. Whole-grain or whole-wheat pasta. Brown rice. [place]. Bulgur. Whole-grain and low-sodium cereals. Pita bread. Low-fat, low-sodium crackers. Whole-wheat flour tortillas. Meats and other proteins Skinless chicken or [place]. Ground chicken or [place]. Pork with fat trimmed off. Fish and seafood. Egg whites. Dried beans, peas, or lentils. Unsalted nuts, nut butters, and seeds. Unsalted canned beans. Lean cuts of beef with fat trimmed off. Low-sodium, lean precooked or cured meat, such as sausages or meat loaves. Dairy Low-fat (1%) or fat-free (skim) milk. Reduced-fat, low-fat, or fat-free cheeses. Nonfat, low-sodium ricotta or cottage cheese. Low-fat or nonfat yogurt. Low-fat, low-sodium cheese. Fats and oils Soft margarine without trans fats. Vegetable oil. Reduced-fat, low-fat, or light mayonnaise and salad dressings (reduced-sodium). Canola, safflower, olive, avocado, soybean, and sunflower oils. Avocado. Seasonings and condiments Herbs. Spices. Seasoning mixes without salt. Other foods Unsalted popcorn and pretzels. Fat-free sweets. The items listed above may not be a complete list of foods and beverages you can eat. Contact a dietitian for more information. What foods  should I avoid? Fruits Canned fruit in a light or heavy syrup. Fried fruit. Fruit in cream or butter sauce. Vegetables Creamed or fried vegetables. Vegetables in a cheese sauce. Regular canned vegetables (not low-sodium or reduced-sodium). Regular canned tomato sauce and paste (not low-sodium or reduced-sodium). Regular tomato and vegetable juice (not low-sodium or reduced-sodium). Angie Fava. Olives. Grains Baked goods made with fat, such as croissants, muffins, or some breads. Dry pasta or rice meal packs. Meats and other proteins Fatty cuts of meat. Ribs. Fried meat. Berniece Salines. Bologna, salami, and other precooked or cured meats, such as sausages or meat loaves. Fat from the back of a pig (fatback). Bratwurst. Salted nuts and seeds. Canned beans with added salt. Canned or smoked fish. Whole eggs or egg yolks. Chicken or [place] with skin.  Dairy Whole or 2% milk, cream, and half-and-half. Whole or full-fat cream cheese. Whole-fat or sweetened yogurt. Full-fat cheese. Nondairy creamers. Whipped toppings. Processed cheese and cheese spreads. Fats and oils Butter. Stick margarine. Lard. Shortening. Ghee. Bacon fat. Tropical oils, such as coconut, palm kernel, or palm oil. Seasonings and condiments Onion salt, garlic salt, seasoned salt, table salt, and sea salt. Worcestershire sauce. Tartar sauce. Barbecue sauce. Teriyaki sauce. Soy sauce, including reduced-sodium. Steak sauce. Canned and packaged gravies. Fish sauce. Oyster sauce. Cocktail sauce. Store-bought horseradish. Ketchup. Mustard. Meat flavorings and tenderizers. Bouillon cubes. Hot sauces. Pre-made or packaged marinades. Pre-made or packaged taco seasonings. Relishes. Regular salad dressings. Other foods Salted popcorn and pretzels. The items listed above may not be a complete list of foods and beverages you should avoid. Contact a dietitian for more information. Where to find more information  National Heart, Lung, and Blood Institute:  https://wilson-eaton.com/  American Heart Association: www.heart.org  Academy of Nutrition and Dietetics: www.eatright.[place]: www.kidney.org Summary  The DASH eating plan is a healthy eating plan that has been shown to reduce high blood pressure (hypertension). It may also reduce your risk for type 2 diabetes, heart disease, and stroke.  When on the DASH eating plan, aim to eat more fresh fruits and vegetables, whole grains, lean proteins, low-fat dairy, and heart-healthy fats.  With the DASH eating plan, you should limit salt (sodium) intake to 2,300 mg a day. If you have hypertension, you may need to reduce your sodium intake to 1,500 mg a day.  Work with your health care provider or dietitian to adjust your eating plan to your individual calorie needs. This information is not intended to replace advice given to you by your health care provider. Make sure you discuss any questions you have with your health care provider. Document Revised: 08/19/2019 Document Reviewed: 08/19/2019 Elsevier Patient Education  2021 [place].  Hypertension, Adult High blood pressure (hypertension) is when the force of blood pumping through the arteries is too strong. The arteries are the blood vessels that carry blood from the heart throughout the body. Hypertension forces the heart to work harder to pump blood and may cause arteries to become narrow or stiff. Untreated or uncontrolled hypertension can cause a heart attack, heart failure, a stroke, kidney disease, and other problems. A blood pressure reading consists of a higher number over a lower number. Ideally, your blood pressure should be below 120/80. The first ("top") number is called the systolic pressure. It is a measure of the pressure in your arteries as your heart beats. The second ("bottom") number is called the diastolic pressure. It is a measure of the pressure in your arteries as the heart relaxes. What are the causes? The  exact cause of this condition is not known. There are some conditions that result in or are related to high blood pressure. What increases the risk? Some risk factors for high blood pressure are under your control. The following factors may make you more likely to develop this condition:  Smoking.  Having type 2 diabetes mellitus, high cholesterol, or both.  Not getting enough exercise or physical activity.  Being overweight.  Having too much fat, sugar, calories, or salt (sodium) in your diet.  Drinking too much alcohol. Some risk factors for high blood pressure may be difficult or impossible to change. Some of these factors include:  Having chronic kidney disease.  Having a family history of high blood pressure.  Age. Risk increases with age.  Race. You may be at higher risk if you are African American.  Gender. Men are at higher risk than women before age 74. After age 66, women are at higher risk than men.  Having obstructive sleep apnea.  Stress. What are the signs or symptoms? High blood pressure may not cause symptoms. Very high blood pressure (hypertensive crisis) may cause:  Headache.  Anxiety.  Shortness of breath.  Nosebleed.  Nausea and vomiting.  Vision changes.  Severe chest pain.  Seizures. How is this diagnosed? This condition is diagnosed by measuring your blood pressure while you are seated, with your arm resting on a flat surface, your legs uncrossed, and your feet flat on the floor. The cuff of the blood pressure monitor will be placed directly against the skin of your upper arm at the level of your heart. It should be measured at least twice using the same arm. Certain conditions can cause a difference in blood pressure between your right and left arms. Certain factors can cause blood pressure readings to be lower or higher than normal for a short period of time:  When your blood pressure is higher when you are in a health care provider's  office than when you are at home, this is called white coat hypertension. Most people with this condition do not need medicines.  When your blood pressure is higher at home than when you are in a health care provider's office, this is called masked hypertension. Most people with this condition may need medicines to control blood pressure. If you have a high blood pressure reading during one visit or you have normal blood pressure with other risk factors, you may be asked to:  Return on a different day to have your blood pressure checked again.  Monitor your blood pressure at home for 1 week or longer. If you are diagnosed with hypertension, you may have other blood or imaging tests to help your health care provider understand your overall risk for other conditions. How is this treated? This condition is treated by making healthy lifestyle changes, such as eating healthy foods, exercising more, and reducing your alcohol intake. Your health care provider may prescribe medicine if lifestyle changes are not enough to get your blood pressure under control, and if:  Your systolic blood pressure is above 130.  Your diastolic blood pressure is above 80. Your personal target blood pressure may vary depending on your medical conditions, your age, and other factors. Follow these instructions at home: Eating and drinking  Eat a diet that is high in fiber and potassium, and low in sodium, added sugar, and fat. An example eating plan is called the DASH (Dietary Approaches to Stop Hypertension) diet. To eat this way: ? Eat plenty of fresh fruits and vegetables. Try to fill one half of your plate at each meal with fruits and vegetables. ? Eat whole grains, such as whole-wheat pasta, brown rice, or whole-grain bread. Fill about one fourth of your plate with whole grains. ? Eat or drink low-fat dairy products, such as skim milk or low-fat yogurt. ? Avoid fatty cuts of meat, processed or cured meats, and poultry  with skin. Fill about one fourth of your plate with lean proteins, such as fish, chicken without skin, beans, eggs, or tofu. ? Avoid pre-made and processed foods. These tend to be higher in sodium, added sugar, and fat.  Reduce your daily sodium intake. Most people with hypertension should eat less than 1,500 mg of sodium a day.  Do not drink alcohol if: ? Your health care provider tells you not to drink. ? You are pregnant, may be pregnant, or are planning to become pregnant.  If you drink alcohol: ? Limit how much you use to:  0-1 drink a day for women.  0-2 drinks a day for men. ? Be aware of how much alcohol is in your drink. In the [place] one drink equals one 12 oz bottle of beer (355 mL), one 5 oz glass of wine (148 mL), or one 1 oz glass of hard liquor (44 mL).   Lifestyle  Work with your health care provider to maintain a healthy body weight or to lose weight. Ask what an ideal weight is for you.  Get at least 30 minutes of exercise most days of the week. Activities may include walking, swimming, or biking.  Include exercise to strengthen your muscles (resistance exercise), such as Pilates or lifting weights, as part of your weekly exercise routine. Try to do these types of exercises for 30 minutes at least 3 days a week.  Do not use any products that contain nicotine or tobacco, such as cigarettes, e-cigarettes, and chewing tobacco. If you need help quitting, ask your health care provider.  Monitor your blood pressure at home as told by your health care provider.  Keep all follow-up visits as told by your health care provider. This is important.   Medicines  Take over-the-counter and prescription medicines only as told by your health care provider. Follow directions carefully. Blood pressure medicines must be taken as prescribed.  Do not skip doses of blood pressure medicine. Doing this puts you at risk for problems and can make the medicine less effective.  Ask your  health care provider about side effects or reactions to medicines that you should watch for. Contact a health care provider if you:  Think you are having a reaction to a medicine you are taking.  Have headaches that keep coming back (recurring).  Feel dizzy.  Have swelling in your ankles.  Have trouble with your vision. Get help right away if you:  Develop a severe headache or confusion.  Have unusual weakness or numbness.  Feel faint.  Have severe pain in your chest or abdomen.  Vomit repeatedly.  Have trouble breathing. Summary  Hypertension is when the force of blood pumping through your arteries is too strong. If this condition is not controlled, it may put you at risk for serious complications.  Your personal target blood pressure may vary depending on your medical conditions, your age, and other factors. For most people, a normal blood pressure is less than 120/80.  Hypertension is treated with lifestyle changes, medicines, or a combination of both. Lifestyle changes include losing weight, eating a healthy, low-sodium diet, exercising more, and limiting alcohol. This information is not intended to replace advice given to you by your health care provider. Make sure you discuss any questions you have with your health care provider. Document Revised: 05/26/2018 Document Reviewed: 05/26/2018 Elsevier Patient Education  2021 [employer].

## 2020-11-26 LAB — HEPATITIS A ANTIBODY, TOTAL: Hepatitis A AB,Total: REACTIVE — AB

## 2020-11-27 ENCOUNTER — Telehealth: Payer: Self-pay | Admitting: Internal Medicine

## 2020-11-27 NOTE — Telephone Encounter (Signed)
Patient called in for lab results  ?

## 2020-11-29 NOTE — Telephone Encounter (Signed)
James Andrews, CMA  11/29/2020 11:17 AM EST Back to Top     Pt called and results reviewed. Pt was able to view online & feels is trending in right direction. He will purchase OTC vitamin D 4000 IU.    Nino Glow McLean-Scocuzza, MD  11/27/2020  8:07 AM EST      Liver enzymes elevated likely due to fatty liver Cholesterol increased again  -rec healthy diet and exercise  Gout # improved 6.5 almost at goal <6.0  Vitamin D low rec vitamin D3 4000 IU daily otc A1C 5.8 improved from 6.1  -rec healthy diet and exercise  White blood cell count low not worried about this his baseline is low will continue to monitor  Hep A lab protected

## 2021-07-25 ENCOUNTER — Ambulatory Visit: Payer: 59 | Admitting: Internal Medicine

## 2021-07-25 ENCOUNTER — Encounter: Payer: Self-pay | Admitting: Internal Medicine

## 2021-07-25 ENCOUNTER — Other Ambulatory Visit: Payer: Self-pay

## 2021-07-25 VITALS — BP 134/96 | HR 106 | Temp 97.9°F | Ht 71.65 in | Wt 216.4 lb

## 2021-07-25 DIAGNOSIS — R748 Abnormal levels of other serum enzymes: Secondary | ICD-10-CM | POA: Diagnosis not present

## 2021-07-25 DIAGNOSIS — M1A9XX0 Chronic gout, unspecified, without tophus (tophi): Secondary | ICD-10-CM

## 2021-07-25 DIAGNOSIS — J309 Allergic rhinitis, unspecified: Secondary | ICD-10-CM

## 2021-07-25 DIAGNOSIS — E559 Vitamin D deficiency, unspecified: Secondary | ICD-10-CM

## 2021-07-25 DIAGNOSIS — M109 Gout, unspecified: Secondary | ICD-10-CM | POA: Diagnosis not present

## 2021-07-25 DIAGNOSIS — M545 Low back pain, unspecified: Secondary | ICD-10-CM

## 2021-07-25 DIAGNOSIS — Z1329 Encounter for screening for other suspected endocrine disorder: Secondary | ICD-10-CM

## 2021-07-25 DIAGNOSIS — R7303 Prediabetes: Secondary | ICD-10-CM | POA: Diagnosis not present

## 2021-07-25 DIAGNOSIS — Z1283 Encounter for screening for malignant neoplasm of skin: Secondary | ICD-10-CM

## 2021-07-25 DIAGNOSIS — I10 Essential (primary) hypertension: Secondary | ICD-10-CM

## 2021-07-25 DIAGNOSIS — M25511 Pain in right shoulder: Secondary | ICD-10-CM

## 2021-07-25 DIAGNOSIS — K760 Fatty (change of) liver, not elsewhere classified: Secondary | ICD-10-CM

## 2021-07-25 DIAGNOSIS — B360 Pityriasis versicolor: Secondary | ICD-10-CM

## 2021-07-25 DIAGNOSIS — G8929 Other chronic pain: Secondary | ICD-10-CM

## 2021-07-25 DIAGNOSIS — Z1389 Encounter for screening for other disorder: Secondary | ICD-10-CM

## 2021-07-25 DIAGNOSIS — L989 Disorder of the skin and subcutaneous tissue, unspecified: Secondary | ICD-10-CM

## 2021-07-25 LAB — COMPREHENSIVE METABOLIC PANEL
ALT: 134 U/L — ABNORMAL HIGH (ref 0–53)
AST: 70 U/L — ABNORMAL HIGH (ref 0–37)
Albumin: 4.9 g/dL (ref 3.5–5.2)
Alkaline Phosphatase: 61 U/L (ref 39–117)
BUN: 13 mg/dL (ref 6–23)
CO2: 30 mEq/L (ref 19–32)
Calcium: 9.2 mg/dL (ref 8.4–10.5)
Chloride: 102 mEq/L (ref 96–112)
Creatinine, Ser: 0.98 mg/dL (ref 0.40–1.50)
GFR: 96.14 mL/min (ref >60.00)
Glucose, Bld: 91 mg/dL (ref 70–99)
Potassium: 4 mEq/L (ref 3.5–5.1)
Sodium: 140 mEq/L (ref 135–145)
Total Bilirubin: 0.6 mg/dL (ref 0.2–1.2)
Total Protein: 7.9 g/dL (ref 6.0–8.3)

## 2021-07-25 LAB — CBC WITH DIFFERENTIAL/PLATELET
Basophils Absolute: 0 10*3/uL (ref 0.0–0.1)
Basophils Relative: 0.6 % (ref 0.0–3.0)
Eosinophils Absolute: 0.2 10*3/uL (ref 0.0–0.7)
Eosinophils Relative: 7.6 % — ABNORMAL HIGH (ref 0.0–5.0)
HCT: 44.7 % (ref 39.0–52.0)
Hemoglobin: 14.6 g/dL (ref 13.0–17.0)
Lymphocytes Relative: 36.5 % (ref 12.0–46.0)
Lymphs Abs: 1.1 10*3/uL (ref 0.7–4.0)
MCHC: 32.6 g/dL (ref 30.0–36.0)
MCV: 95.8 fl (ref 78.0–100.0)
Monocytes Absolute: 0.3 10*3/uL (ref 0.1–1.0)
Monocytes Relative: 11.5 % (ref 3.0–12.0)
Neutro Abs: 1.3 10*3/uL — ABNORMAL LOW (ref 1.4–7.7)
Neutrophils Relative %: 43.8 % (ref 43.0–77.0)
Platelets: 177 10*3/uL (ref 150.0–400.0)
RBC: 4.67 Mil/uL (ref 4.22–5.81)
RDW: 14.8 % (ref 11.5–15.5)
WBC: 3 10*3/uL — ABNORMAL LOW (ref 4.0–10.5)

## 2021-07-25 LAB — LIPID PANEL
Cholesterol: 211 mg/dL — ABNORMAL HIGH (ref 0–200)
HDL: 66 mg/dL (ref >39.00)
LDL Cholesterol: 136 mg/dL — ABNORMAL HIGH (ref 0–99)
NonHDL: 144.54
Total CHOL/HDL Ratio: 3
Triglycerides: 44 mg/dL (ref 0.0–149.0)
VLDL: 8.8 mg/dL (ref 0.0–40.0)

## 2021-07-25 LAB — HEMOGLOBIN A1C: Hgb A1c MFr Bld: 5.8 % (ref 4.6–6.5)

## 2021-07-25 LAB — VITAMIN D 25 HYDROXY (VIT D DEFICIENCY, FRACTURES): VITD: 25.51 ng/mL — ABNORMAL LOW (ref 30.00–100.00)

## 2021-07-25 LAB — URIC ACID: Uric Acid, Serum: 10.7 mg/dL — ABNORMAL HIGH (ref 4.0–7.8)

## 2021-07-25 LAB — TSH: TSH: 1.14 u[IU]/mL (ref 0.35–5.50)

## 2021-07-25 MED ORDER — KETOCONAZOLE 2 % EX SHAM: 1.0000 "application " | MEDICATED_SHAMPOO | CUTANEOUS | 11 refills | Status: DC

## 2021-07-25 MED ORDER — FLUTICASONE PROPIONATE 50 MCG/ACT NA SUSP: 2.0000 | Freq: Every day | NASAL | 11 refills | Status: DC

## 2021-07-25 MED ORDER — CYCLOBENZAPRINE HCL 10 MG PO TABS: 10.0000 mg | ORAL_TABLET | Freq: Every day | ORAL | 11 refills | Status: DC | PRN

## 2021-07-25 MED ORDER — ALLOPURINOL 300 MG PO TABS: 300.0000 mg | ORAL_TABLET | Freq: Every day | ORAL | 3 refills | Status: DC

## 2021-07-25 MED ORDER — AMLODIPINE BESYLATE 2.5 MG PO TABS: 2.5000 mg | ORAL_TABLET | Freq: Every day | ORAL | 3 refills | Status: DC

## 2021-07-25 MED ORDER — INDOMETHACIN 50 MG PO CAPS: ORAL_CAPSULE | ORAL | 3 refills | Status: DC

## 2021-07-25 NOTE — Patient Instructions (Addendum)
Dr. Brendolyn Patty   Primary Contact Information  Phone Fax E-mail Address  [phone] Not available [address]   [place] [address]     Specialties     Dermatology             Department  Name Directions  [facility] [[phone]]    Amlodipine Tablets What is this medication? AMLODIPINE (am LOE di peen) treats high blood pressure and prevents chest pain (angina). It works by relaxing the blood vessels, which helps decrease the amount of work your heart has to do. It belongs to a group of medications called calcium channel blockers. This medicine may be used for other purposes; ask your health care provider or pharmacist if you have questions. COMMON BRAND NAME(S): Norvasc What should I tell my care team before I take this medication? They need to know if you have any of these conditions: Heart disease Liver disease An unusual or allergic reaction to amlodipine, other medications, foods, dyes, or preservatives Pregnant or trying to get pregnant Breast-feeding How should I use this medication? Take this medication by mouth. Take it as directed on the prescription label at the same time every day. You can take it with or without food. If it upsets your stomach, take it with food. Keep taking it unless your care team tells you to stop. Talk to your care team about the use of this medication in children. While it may be prescribed for children as young as 6 for selected conditions, precautions do apply. Overdosage: If you think you have taken too much of this medicine contact a poison control center or emergency room at once. NOTE: This medicine is only for you. Do not share this medicine with others. What if I miss a dose? If you miss a dose, take it as soon as you can. If it is almost time for your next dose, take only that dose. Do not take double or extra doses. What may interact with this  medication? Clarithromycin Cyclosporine Diltiazem Itraconazole Simvastatin Tacrolimus This list may not describe all possible interactions. Give your health care provider a list of all the medicines, herbs, non-prescription drugs, or dietary supplements you use. Also tell them if you smoke, drink alcohol, or use illegal drugs. Some items may interact with your medicine. What should I watch for while using this medication? Visit your health care provider for regular checks on your progress. Check your blood pressure as directed. Ask your health care provider what your blood pressure should be. Also, find out when you should contact him or her. Do not treat yourself for coughs, colds, or pain while you are using this medication without asking your health care provider for advice. Some medications may increase your blood pressure. You may get drowsy or dizzy. Do not drive, use machinery, or do anything that needs mental alertness until you know how this medication affects you. Do not stand up or sit up quickly, especially if you are an older patient. This reduces the risk of dizzy or fainting spells. Alcohol can make you more drowsy and dizzy. Avoid alcoholic drinks. What side effects may I notice from receiving this medication? Side effects that you should report to your care team as soon as possible: Allergic reactions-skin rash, itching, hives, swelling of the face, lips, tongue, or throat Heart attack-pain or tightness in the chest, shoulders, arms, or jaw, nausea, shortness of breath, cold or clammy skin, feeling faint or lightheaded Low blood pressure-dizziness, feeling faint or  lightheaded, blurry vision Side effects that usually do not require medical attention (report these to your care team if they continue or are bothersome): Facial flushing, redness Heart palpitations-rapid, pounding, or irregular heartbeat Nausea Stomach pain Swelling of the ankles, hands, or feet This list may not  describe all possible side effects. Call your doctor for medical advice about side effects. You may report side effects to FDA at 1-800-FDA-1088. Where should I keep my medication? Keep out of the reach of children and pets. Store at room temperature between 20 and 25 degrees C (68 and 77 degrees F). Protect from light and moisture. Keep the container tightly closed. Get rid of any unused medication after the expiration date. To get rid of medications that are no longer needed or have expired: Take the medication to a medication take-back program. Check with your pharmacy or law enforcement to find a location. If you cannot return the medication, check the label or package insert to see if the medication should be thrown out in the garbage or flushed down the toilet. If you are not sure, ask your health care provider. If it is safe to put in the trash, empty the medication out of the container. Mix the medication with cat litter, dirt, coffee grounds, or other unwanted substance. Seal the mixture in a bag or container. Put it in the trash. NOTE: This sheet is a summary. It may not cover all possible information. If you have questions about this medicine, talk to your doctor, pharmacist, or health care provider.  2022 Elsevier/Gold Standard (2020-08-11 14:59:47)  Low-Purine Eating Plan A low-purine eating plan involves making food choices to limit your intake of purine. Purine is a kind of uric acid. Too much uric acid in your blood can cause certain conditions, such as gout and kidney stones. Eating a low-purine diet can help control these conditions. What are tips for following this plan? Reading food labels Avoid foods with saturated or Trans fat. Check the ingredient list of grains-based foods, such as bread and cereal, to make sure that they contain whole grains. Check the ingredient list of sauces or soups to make sure they do not contain meat or fish. When choosing soft drinks, check the  ingredient list to make sure they do not contain high-fructose corn syrup. Shopping  Buy plenty of fresh fruits and vegetables. Avoid buying canned or fresh fish. Buy dairy products labeled as low-fat or nonfat. Avoid buying premade or processed foods. These foods are often high in fat, salt (sodium), and added sugar. Cooking Use olive oil instead of butter when cooking. Oils like olive oil, canola oil, and sunflower oil contain healthy fats. Meal planning Learn which foods do or do not affect you. If you find out that a food tends to cause your gout symptoms to flare up, avoid eating that food. You can enjoy foods that do not cause problems. If you have any questions about a food item, talk with your dietitian or health care provider. Limit foods high in fat, especially saturated fat. Fat makes it harder for your body to get rid of uric acid. Choose foods that are lower in fat and are lean sources of protein. General guidelines Limit alcohol intake to no more than 1 drink a day for nonpregnant women and 2 drinks a day for men. One drink equals 12 oz of beer, 5 oz of wine, or 1 oz of hard liquor. Alcohol can affect the way your body gets rid of uric acid. Drink  plenty of water to keep your urine clear or pale yellow. Fluids can help remove uric acid from your body. If directed by your health care provider, take a vitamin C supplement. Work with your health care provider and dietitian to develop a plan to achieve or maintain a healthy weight. Losing weight can help reduce uric acid in your blood. What foods are recommended? The items listed may not be a complete list. Talk with your dietitian about what dietary choices are best for you. Foods low in purines Foods low in purines do not need to be limited. These include: All fruits. All low-purine vegetables, pickles, and olives. Breads, pasta, rice, cornbread, and popcorn. Cake and other baked goods. All dairy foods. Eggs, nuts, and nut  butters. Spices and condiments, such as salt, herbs, and vinegar. Plant oils, butter, and margarine. Water, sugar-free soft drinks, tea, coffee, and cocoa. Vegetable-based soups, broths, sauces, and gravies. Foods moderate in purines Foods moderate in purines should be limited to the amounts listed.  cup of asparagus, cauliflower, spinach, mushrooms, or green peas, each day. 2/3 cup uncooked oatmeal, each day.  cup dry wheat bran or wheat germ, each day. 2-3 ounces of meat or poultry, each day. 4-6 ounces of shellfish, such as crab, lobster, oysters, or shrimp, each day. 1 cup cooked beans, peas, or lentils, each day. Soup, broths, or bouillon made from meat or fish. Limit these foods as much as possible. What foods are not recommended? The items listed may not be a complete list. Talk with your dietitian about what dietary choices are best for you. Limit your intake of foods high in purines, including: Beer and other alcohol. Meat-based gravy or sauce. Canned or fresh fish, such as: Anchovies, sardines, herring, and tuna. Mussels and scallops. Codfish, trout, and haddock. Berniece Salines. Organ meats, such as: Liver or kidney. Tripe. Sweetbreads (thymus gland or pancreas). Wild Clinical biochemist. Yeast or yeast extract supplements. Drinks sweetened with high-fructose corn syrup. Summary Eating a low-purine diet can help control conditions caused by too much uric acid in the body, such as gout or kidney stones. Choose low-purine foods, limit alcohol, and limit foods high in fat. You will learn over time which foods do or do not affect you. If you find out that a food tends to cause your gout symptoms to flare up, avoid eating that food. This information is not intended to replace advice given to you by your health care provider. Make sure you discuss any questions you have with your health care provider. Document Revised: 12/29/2019 Document Reviewed: 12/29/2019 Elsevier Patient Education   [employer].

## 2021-07-25 NOTE — Progress Notes (Signed)
Chief Complaint  Patient presents with   Follow-up   F/u  1. Htn elevated no sxs will add norvasc today no FH htn  2. Gout no flare last uric acic 6.5 missed allopurinol for a few days and no flare triggers bacon and sugar and cut down on sugar  3. Right shoulder pain declines Xray today   Review of Systems  Constitutional:  Negative for weight loss.  HENT:  Negative for hearing loss.   Eyes:  Negative for blurred vision.  Respiratory:  Negative for shortness of breath.   Cardiovascular:  Negative for chest pain.  Gastrointestinal:  Negative for abdominal pain.  Musculoskeletal:  Positive for joint pain.  Skin:  Negative for rash.  Neurological:  Negative for headaches.  Psychiatric/Behavioral:  Negative for depression.   Past Medical History:  Diagnosis Date   Chicken pox    COVID-19 virus detected    09/2019   Fatty liver    Gout    Seizures ([place])    Absence seizure as a child.   Past Surgical History:  Procedure Laterality Date   FRACTURE SURGERY     Hand fracture   Family History  Problem Relation Age of Onset   Testicular cancer Brother        dx age 6   Cancer Brother        testicular   Arthritis Maternal Grandmother    Breast cancer Maternal Grandmother    Arthritis Paternal Grandfather    Diabetes Paternal Grandfather    Non-Hodgkin's lymphoma Paternal Aunt        from round up    Social History   Socioeconomic History   Marital status: Married    Spouse name: Not on file   Number of children: Not on file   Years of education: Not on file   Highest education level: Not on file  Occupational History   Not on file  Tobacco Use   Smoking status: Former   Smokeless tobacco: Former    Types: Chew    Quit date: 06/25/2017  Substance and Sexual Activity   Alcohol use: No   Drug use: No   Sexual activity: Yes    Partners: Female  Other Topics Concern   Not on file  Social History Narrative   Works with hazardous waste    Married    Social  Determinants of Radio broadcast assistant Strain: Not on file  Food Insecurity: Not on file  Transportation Needs: Not on file  Physical Activity: Not on file  Stress: Not on file  Social Connections: Not on file  Intimate Partner Violence: Not on file   Current Meds  Medication Sig   amLODipine (NORVASC) 2.5 MG tablet Take 1 tablet (2.5 mg total) by mouth daily.   cyclobenzaprine (FLEXERIL) 10 MG tablet Take 1 tablet (10 mg total) by mouth daily as needed for muscle spasms. Low back d/c baclofen 10 due to cost   fluticasone (FLONASE) 50 MCG/ACT nasal spray Place 2 sprays into both nostrils daily.   indomethacin (INDOCIN) 50 MG capsule TAKE ONE CAPSULE BY MOUTH 3 TIMES A DAY WITH FOOD PRN GOUT FLARE **MAY STOP WHEN PAIN RESOLVED**   ketoconazole (NIZORAL) 2 % shampoo Apply 1 application topically 2 (two) times a week. Lather and let sit x 5 minutes   Multiple Vitamin (MULTIVITAMIN) tablet Take 1 tablet by mouth daily.   No Known Allergies No results found for this or any previous visit (from the past 2160 hour(s)).  Objective  Body mass index is 29.64 kg/m. Wt Readings from Last 3 Encounters:  07/25/21 216 lb 6.4 oz (98.2 kg)  11/23/20 214 lb 6.4 oz (97.3 kg)  05/17/20 215 lb (97.5 kg)   Temp Readings from Last 3 Encounters:  07/25/21 97.9 F (36.6 C) (Oral)  11/23/20 98.1 F (36.7 C) (Oral)  05/17/20 98.6 F (37 C)   BP Readings from Last 3 Encounters:  07/25/21 (!) 134/96  11/23/20 (!) 142/94  07/19/20 138/82   Pulse Readings from Last 3 Encounters:  07/25/21 (!) 106  11/23/20 (!) 115  07/19/20 80    Physical Exam Vitals and nursing note reviewed.  Constitutional:      Appearance: Normal appearance. He is well-developed and well-groomed.  HENT:     Head: Normocephalic and atraumatic.  Eyes:     Conjunctiva/sclera: Conjunctivae normal.     Pupils: Pupils are equal, round, and reactive to light.  Cardiovascular:     Rate and Rhythm: Normal rate and regular  rhythm.     Heart sounds: Normal heart sounds. No murmur heard. Skin:    General: Skin is warm and dry.  Neurological:     General: No focal deficit present.     Mental Status: He is alert and oriented to person, place, and time. Mental status is at baseline.     Gait: Gait normal.  Psychiatric:        Attention and Perception: Attention and perception normal.        Mood and Affect: Mood and affect normal.        Speech: Speech normal.        Behavior: Behavior normal. Behavior is cooperative.        Thought Content: Thought content normal.        Cognition and Memory: Cognition and memory normal.        Judgment: Judgment normal.    Assessment  Plan  Hypertension, - Plan: amLODipine (NORVASC) 2.5 MG tablet monitor BP goal <130/<80, Comprehensive metabolic panel, Lipid panel, CBC with Differential/Platelet Consider losartan 25 mg with h/o gout in the future   Elevated liver enzymes +hep steatosis Plan: Comprehensive metabolic panel  Gout, unspecified cause, unspecified chronicity, unspecified site - Plan: Uric acid allopurinol   Prediabetes - Plan: Hemoglobin A1c  Skin cancer screening - Plan: Ambulatory referral to Dermatology Skin lesion - Plan: Ambulatory referral to Dermatology Check back and left ear and tbse   Acute pain of right shoulder Consider Xray if continues to bother   HM Declines flu shot  Tdap had 11/2011   moderna 2/2 [facility] declines booster had covid +   Hep A vaccine 2/2 today last dose immune  Hep B immune and MMR immune Declines STD check been with wife x years married since 2013   Congratulated on quitting smoking and chew  -former smoker quit 10 years ago smoked x 10 years 1 ppd no FH lung cancer  Check fasting today rec healthy diet and exercise   rec take D3 5000 IU daily   Provider: Dr. Olivia Mackie McLean-Scocuzza-Internal Medicine

## 2021-07-26 ENCOUNTER — Telehealth: Payer: Self-pay

## 2021-07-26 LAB — MICROSCOPIC MESSAGE

## 2021-07-26 LAB — URINALYSIS, ROUTINE W REFLEX MICROSCOPIC
Bacteria, UA: NONE SEEN /HPF
Bilirubin Urine: NEGATIVE
Glucose, UA: NEGATIVE
Hgb urine dipstick: NEGATIVE
Hyaline Cast: NONE SEEN /LPF
Ketones, ur: NEGATIVE
Leukocytes,Ua: NEGATIVE
Nitrite: NEGATIVE
RBC / HPF: NONE SEEN /HPF (ref 0–2)
Specific Gravity, Urine: 1.017 (ref 1.001–1.035)
Squamous Epithelial / HPF: NONE SEEN /HPF (ref <=5)
WBC, UA: NONE SEEN /HPF (ref 0–5)
pH: 5.5 (ref 5.0–8.0)

## 2021-07-26 NOTE — Telephone Encounter (Signed)
-----   Message from Delorise Jackson, MD sent at 07/26/2021  8:02 AM EDT ----- Urine has mild protein  Vitamin D low rec over the counter D3 4000 IU daily over the counter Liver enzymes worse and elevated -is he agreeable to see [facility] to work this up? He has fatty liver but consult to see if they are worried about anything else?  Cholesterol increasing  -rec healthy diet and exercise  Uric acid increased 6.5 to 10.7  -make sure taking allopurinol daily goal is <6.0 Gout diet  Thyroid lab normal  White blood cell count slightly low normal is 4.0 his is 3.0 -if this continues consider hematology consult to further work up and make  sure this is normal it can be for some people  Consider taking a multivitamin daily A1C 5.8=prediabetes

## 2021-08-05 NOTE — Addendum Note (Signed)
Addended by: Orland Mustard on: 08/05/2021 03:49 AM   Modules accepted: Orders

## 2021-10-08 DIAGNOSIS — K760 Fatty (change of) liver, not elsewhere classified: Secondary | ICD-10-CM | POA: Diagnosis not present

## 2021-10-08 DIAGNOSIS — R7401 Elevation of levels of liver transaminase levels: Secondary | ICD-10-CM | POA: Diagnosis not present

## 2021-10-09 ENCOUNTER — Other Ambulatory Visit: Payer: Self-pay | Admitting: Gastroenterology

## 2021-10-10 ENCOUNTER — Other Ambulatory Visit: Payer: Self-pay | Admitting: Gastroenterology

## 2021-10-10 DIAGNOSIS — R7401 Elevation of levels of liver transaminase levels: Secondary | ICD-10-CM

## 2021-10-10 DIAGNOSIS — K760 Fatty (change of) liver, not elsewhere classified: Secondary | ICD-10-CM

## 2021-10-16 ENCOUNTER — Other Ambulatory Visit: Payer: Self-pay | Admitting: Internal Medicine

## 2021-10-16 DIAGNOSIS — M545 Low back pain, unspecified: Secondary | ICD-10-CM

## 2021-10-16 DIAGNOSIS — G8929 Other chronic pain: Secondary | ICD-10-CM

## 2021-10-18 ENCOUNTER — Other Ambulatory Visit: Payer: Self-pay

## 2021-10-18 ENCOUNTER — Ambulatory Visit
Admission: RE | Admit: 2021-10-18 | Discharge: 2021-10-18 | Disposition: A | Discharge status: Home / Self Care | Payer: BC Managed Care – PPO | Source: Ambulatory Visit | Attending: Gastroenterology | Admitting: Gastroenterology

## 2021-10-18 DIAGNOSIS — R7401 Elevation of levels of liver transaminase levels: Secondary | ICD-10-CM | POA: Insufficient documentation

## 2021-10-18 DIAGNOSIS — K760 Fatty (change of) liver, not elsewhere classified: Secondary | ICD-10-CM | POA: Insufficient documentation

## 2021-10-18 DIAGNOSIS — Z114 Encounter for screening for human immunodeficiency virus [HIV]: Secondary | ICD-10-CM | POA: Diagnosis not present

## 2021-12-19 DIAGNOSIS — M9901 Segmental and somatic dysfunction of cervical region: Secondary | ICD-10-CM | POA: Diagnosis not present

## 2021-12-19 DIAGNOSIS — M9903 Segmental and somatic dysfunction of lumbar region: Secondary | ICD-10-CM | POA: Diagnosis not present

## 2021-12-19 DIAGNOSIS — M9902 Segmental and somatic dysfunction of thoracic region: Secondary | ICD-10-CM | POA: Diagnosis not present

## 2021-12-19 DIAGNOSIS — M9907 Segmental and somatic dysfunction of upper extremity: Secondary | ICD-10-CM | POA: Diagnosis not present

## 2021-12-20 DIAGNOSIS — M9902 Segmental and somatic dysfunction of thoracic region: Secondary | ICD-10-CM | POA: Diagnosis not present

## 2021-12-20 DIAGNOSIS — M9903 Segmental and somatic dysfunction of lumbar region: Secondary | ICD-10-CM | POA: Diagnosis not present

## 2021-12-20 DIAGNOSIS — M9907 Segmental and somatic dysfunction of upper extremity: Secondary | ICD-10-CM | POA: Diagnosis not present

## 2021-12-20 DIAGNOSIS — M9901 Segmental and somatic dysfunction of cervical region: Secondary | ICD-10-CM | POA: Diagnosis not present

## 2021-12-23 DIAGNOSIS — M9901 Segmental and somatic dysfunction of cervical region: Secondary | ICD-10-CM | POA: Diagnosis not present

## 2021-12-23 DIAGNOSIS — M9902 Segmental and somatic dysfunction of thoracic region: Secondary | ICD-10-CM | POA: Diagnosis not present

## 2021-12-23 DIAGNOSIS — M9907 Segmental and somatic dysfunction of upper extremity: Secondary | ICD-10-CM | POA: Diagnosis not present

## 2021-12-23 DIAGNOSIS — M9903 Segmental and somatic dysfunction of lumbar region: Secondary | ICD-10-CM | POA: Diagnosis not present

## 2021-12-25 DIAGNOSIS — M9907 Segmental and somatic dysfunction of upper extremity: Secondary | ICD-10-CM | POA: Diagnosis not present

## 2021-12-25 DIAGNOSIS — M9902 Segmental and somatic dysfunction of thoracic region: Secondary | ICD-10-CM | POA: Diagnosis not present

## 2021-12-25 DIAGNOSIS — M9901 Segmental and somatic dysfunction of cervical region: Secondary | ICD-10-CM | POA: Diagnosis not present

## 2021-12-25 DIAGNOSIS — M9903 Segmental and somatic dysfunction of lumbar region: Secondary | ICD-10-CM | POA: Diagnosis not present

## 2021-12-27 DIAGNOSIS — M9902 Segmental and somatic dysfunction of thoracic region: Secondary | ICD-10-CM | POA: Diagnosis not present

## 2021-12-27 DIAGNOSIS — M9907 Segmental and somatic dysfunction of upper extremity: Secondary | ICD-10-CM | POA: Diagnosis not present

## 2021-12-27 DIAGNOSIS — M9901 Segmental and somatic dysfunction of cervical region: Secondary | ICD-10-CM | POA: Diagnosis not present

## 2021-12-27 DIAGNOSIS — M9903 Segmental and somatic dysfunction of lumbar region: Secondary | ICD-10-CM | POA: Diagnosis not present

## 2021-12-30 DIAGNOSIS — M9902 Segmental and somatic dysfunction of thoracic region: Secondary | ICD-10-CM | POA: Diagnosis not present

## 2021-12-30 DIAGNOSIS — M9907 Segmental and somatic dysfunction of upper extremity: Secondary | ICD-10-CM | POA: Diagnosis not present

## 2021-12-30 DIAGNOSIS — M9901 Segmental and somatic dysfunction of cervical region: Secondary | ICD-10-CM | POA: Diagnosis not present

## 2021-12-30 DIAGNOSIS — M9903 Segmental and somatic dysfunction of lumbar region: Secondary | ICD-10-CM | POA: Diagnosis not present

## 2021-12-31 DIAGNOSIS — M9902 Segmental and somatic dysfunction of thoracic region: Secondary | ICD-10-CM | POA: Diagnosis not present

## 2021-12-31 DIAGNOSIS — M9901 Segmental and somatic dysfunction of cervical region: Secondary | ICD-10-CM | POA: Diagnosis not present

## 2021-12-31 DIAGNOSIS — M9903 Segmental and somatic dysfunction of lumbar region: Secondary | ICD-10-CM | POA: Diagnosis not present

## 2021-12-31 DIAGNOSIS — M9907 Segmental and somatic dysfunction of upper extremity: Secondary | ICD-10-CM | POA: Diagnosis not present

## 2022-01-01 DIAGNOSIS — M9903 Segmental and somatic dysfunction of lumbar region: Secondary | ICD-10-CM | POA: Diagnosis not present

## 2022-01-01 DIAGNOSIS — M9907 Segmental and somatic dysfunction of upper extremity: Secondary | ICD-10-CM | POA: Diagnosis not present

## 2022-01-01 DIAGNOSIS — M9902 Segmental and somatic dysfunction of thoracic region: Secondary | ICD-10-CM | POA: Diagnosis not present

## 2022-01-01 DIAGNOSIS — M9901 Segmental and somatic dysfunction of cervical region: Secondary | ICD-10-CM | POA: Diagnosis not present

## 2022-01-03 DIAGNOSIS — M9907 Segmental and somatic dysfunction of upper extremity: Secondary | ICD-10-CM | POA: Diagnosis not present

## 2022-01-03 DIAGNOSIS — M9903 Segmental and somatic dysfunction of lumbar region: Secondary | ICD-10-CM | POA: Diagnosis not present

## 2022-01-03 DIAGNOSIS — M9901 Segmental and somatic dysfunction of cervical region: Secondary | ICD-10-CM | POA: Diagnosis not present

## 2022-01-03 DIAGNOSIS — M9902 Segmental and somatic dysfunction of thoracic region: Secondary | ICD-10-CM | POA: Diagnosis not present

## 2022-01-13 DIAGNOSIS — M9907 Segmental and somatic dysfunction of upper extremity: Secondary | ICD-10-CM | POA: Diagnosis not present

## 2022-01-13 DIAGNOSIS — M9901 Segmental and somatic dysfunction of cervical region: Secondary | ICD-10-CM | POA: Diagnosis not present

## 2022-01-13 DIAGNOSIS — M9902 Segmental and somatic dysfunction of thoracic region: Secondary | ICD-10-CM | POA: Diagnosis not present

## 2022-01-13 DIAGNOSIS — M9903 Segmental and somatic dysfunction of lumbar region: Secondary | ICD-10-CM | POA: Diagnosis not present

## 2022-01-14 DIAGNOSIS — M9901 Segmental and somatic dysfunction of cervical region: Secondary | ICD-10-CM | POA: Diagnosis not present

## 2022-01-14 DIAGNOSIS — M9907 Segmental and somatic dysfunction of upper extremity: Secondary | ICD-10-CM | POA: Diagnosis not present

## 2022-01-14 DIAGNOSIS — M9902 Segmental and somatic dysfunction of thoracic region: Secondary | ICD-10-CM | POA: Diagnosis not present

## 2022-01-14 DIAGNOSIS — M9903 Segmental and somatic dysfunction of lumbar region: Secondary | ICD-10-CM | POA: Diagnosis not present

## 2022-01-16 DIAGNOSIS — M9903 Segmental and somatic dysfunction of lumbar region: Secondary | ICD-10-CM | POA: Diagnosis not present

## 2022-01-16 DIAGNOSIS — M9902 Segmental and somatic dysfunction of thoracic region: Secondary | ICD-10-CM | POA: Diagnosis not present

## 2022-01-16 DIAGNOSIS — M9907 Segmental and somatic dysfunction of upper extremity: Secondary | ICD-10-CM | POA: Diagnosis not present

## 2022-01-16 DIAGNOSIS — M9901 Segmental and somatic dysfunction of cervical region: Secondary | ICD-10-CM | POA: Diagnosis not present

## 2022-01-17 DIAGNOSIS — M9903 Segmental and somatic dysfunction of lumbar region: Secondary | ICD-10-CM | POA: Diagnosis not present

## 2022-01-17 DIAGNOSIS — M9907 Segmental and somatic dysfunction of upper extremity: Secondary | ICD-10-CM | POA: Diagnosis not present

## 2022-01-17 DIAGNOSIS — M9902 Segmental and somatic dysfunction of thoracic region: Secondary | ICD-10-CM | POA: Diagnosis not present

## 2022-01-17 DIAGNOSIS — M9901 Segmental and somatic dysfunction of cervical region: Secondary | ICD-10-CM | POA: Diagnosis not present

## 2022-01-20 DIAGNOSIS — M9902 Segmental and somatic dysfunction of thoracic region: Secondary | ICD-10-CM | POA: Diagnosis not present

## 2022-01-20 DIAGNOSIS — M9903 Segmental and somatic dysfunction of lumbar region: Secondary | ICD-10-CM | POA: Diagnosis not present

## 2022-01-20 DIAGNOSIS — M9901 Segmental and somatic dysfunction of cervical region: Secondary | ICD-10-CM | POA: Diagnosis not present

## 2022-01-20 DIAGNOSIS — M9907 Segmental and somatic dysfunction of upper extremity: Secondary | ICD-10-CM | POA: Diagnosis not present

## 2022-01-27 ENCOUNTER — Ambulatory Visit: Payer: BC Managed Care – PPO | Admitting: Dermatology

## 2022-01-27 DIAGNOSIS — Z1283 Encounter for screening for malignant neoplasm of skin: Secondary | ICD-10-CM

## 2022-01-27 DIAGNOSIS — L578 Other skin changes due to chronic exposure to nonionizing radiation: Secondary | ICD-10-CM

## 2022-01-27 DIAGNOSIS — D225 Melanocytic nevi of trunk: Secondary | ICD-10-CM | POA: Diagnosis not present

## 2022-01-27 DIAGNOSIS — M9902 Segmental and somatic dysfunction of thoracic region: Secondary | ICD-10-CM | POA: Diagnosis not present

## 2022-01-27 DIAGNOSIS — L821 Other seborrheic keratosis: Secondary | ICD-10-CM | POA: Diagnosis not present

## 2022-01-27 DIAGNOSIS — L814 Other melanin hyperpigmentation: Secondary | ICD-10-CM

## 2022-01-27 DIAGNOSIS — D1801 Hemangioma of skin and subcutaneous tissue: Secondary | ICD-10-CM

## 2022-01-27 DIAGNOSIS — D485 Neoplasm of uncertain behavior of skin: Secondary | ICD-10-CM

## 2022-01-27 DIAGNOSIS — D229 Melanocytic nevi, unspecified: Secondary | ICD-10-CM

## 2022-01-27 DIAGNOSIS — M9903 Segmental and somatic dysfunction of lumbar region: Secondary | ICD-10-CM | POA: Diagnosis not present

## 2022-01-27 DIAGNOSIS — M9907 Segmental and somatic dysfunction of upper extremity: Secondary | ICD-10-CM | POA: Diagnosis not present

## 2022-01-27 DIAGNOSIS — D2239 Melanocytic nevi of other parts of face: Secondary | ICD-10-CM

## 2022-01-27 DIAGNOSIS — M9901 Segmental and somatic dysfunction of cervical region: Secondary | ICD-10-CM | POA: Diagnosis not present

## 2022-01-27 DIAGNOSIS — L905 Scar conditions and fibrosis of skin: Secondary | ICD-10-CM | POA: Diagnosis not present

## 2022-01-27 NOTE — Progress Notes (Signed)
? ?New Patient Visit ? ?Subjective  ?James Andrews is a 41 y.o. male who presents for the following: Other (New patient - spots of back and left hip and a few moles the he would liked checked). ?The patient presents for Upper Body Skin Exam (UBSE) for skin cancer screening and mole check.  The patient has spots, moles and lesions to be evaluated, some may be new or changing and the patient has concerns that these could be cancer. ? ?The following portions of the chart were reviewed this encounter and updated as appropriate:  ? Tobacco  Allergies  Meds  Problems  Med Hx  Surg Hx  Fam Hx   ?  ?Review of Systems:  No other skin or systemic complaints except as noted in HPI or Assessment and Plan. ? ?Objective  ?Well appearing patient in no apparent distress; mood and affect are within normal limits. ? ?All skin waist up examined. ? ?Left nose ?0.2 cm brown macule ? ? ? ? ? ? ?Right sup pectoral ?Irregular brown macule 0.6 cm ? ?Left ear sup helix ?Red papule ? ? ?Assessment & Plan  ? ?Lentigines ?- Scattered tan macules ?- Due to sun exposure ?- Benign-appearing, observe ?- Recommend daily broad spectrum sunscreen SPF 30+ to sun-exposed areas, reapply every 2 hours as needed. ?- Call for any changes ? ?Seborrheic Keratoses ?- Stuck-on, waxy, tan-brown papules and/or plaques  ?- Benign-appearing ?- Discussed benign etiology and prognosis. ?- Observe ?- Call for any changes ? ?Melanocytic Nevi ?- Tan-brown and/or pink-flesh-colored symmetric macules and papules ?- Benign appearing on exam today ?- Observation ?- Call clinic for new or changing moles ?- Recommend daily use of broad spectrum spf 30+ sunscreen to sun-exposed areas.  ? ?Hemangiomas ?- Red papules ?- Discussed benign nature ?- Observe ?- Call for any changes ? ?Actinic Damage ?- Chronic condition, secondary to cumulative UV/sun exposure ?- diffuse scaly erythematous macules with underlying dyspigmentation ?- Recommend daily broad spectrum sunscreen SPF  30+ to sun-exposed areas, reapply every 2 hours as needed.  ?- Staying in the shade or wearing long sleeves, sun glasses (UVA+UVB protection) and wide brim hats (4-inch brim around the entire circumference of the hat) are also recommended for sun protection.  ?- Call for new or changing lesions. ? ?Skin cancer screening performed today. ? ?Nevus ?Left nose ?See photo ?Benign appearing, observe. ? ?Neoplasm of uncertain behavior of skin ?Right sup pectoral ?Epidermal / dermal shaving ? ?Lesion diameter (cm):  0.6 ?Informed consent: discussed and consent obtained   ?Timeout: patient name, date of birth, surgical site, and procedure verified   ?Procedure prep:  Patient was prepped and draped in usual sterile fashion ?Prep type:  Isopropyl alcohol ?Anesthesia: the lesion was anesthetized in a standard fashion   ?Anesthetic:  1% lidocaine w/ epinephrine 1-100,000 buffered w/ 8.4% NaHCO3 ?Instrument used: flexible razor blade   ?Hemostasis achieved with: pressure, aluminum chloride and electrodesiccation   ?Outcome: patient tolerated procedure well   ?Post-procedure details: sterile dressing applied and wound care instructions given   ?Dressing type: bandage and petrolatum   ? ?Specimen 1 - Surgical pathology ?Differential Diagnosis: Nevus vs dysplastic nevus ?Check Margins: No ? 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 ?Return if symptoms worsen or fail to improve. ? ?I, Ashok Cordia, CMA, am acting as scribe for Sarina Ser, MD . ?Documentation: I have reviewed the above documentation for accuracy and completeness, and I agree with the above. ? ?Sarina Ser,  MD ? ?

## 2022-01-27 NOTE — Patient Instructions (Signed)

## 2022-01-28 ENCOUNTER — Ambulatory Visit: Payer: 59 | Admitting: Internal Medicine

## 2022-01-29 ENCOUNTER — Telehealth: Payer: Self-pay

## 2022-01-29 NOTE — Telephone Encounter (Signed)
-----   Message from Ralene Bathe, MD sent at 01/28/2022  8:57 PM EDT ----- ?Diagnosis ?Skin , right sup pectoral ?MELANOCYTIC NEVUS WITH SCAR, BASE INVOLVED, SEE DESCRIPTION ? ?Benign mole- ?No further treatment needed ?

## 2022-01-29 NOTE — Telephone Encounter (Signed)
Advised pt of bx result/sh ?

## 2022-02-04 ENCOUNTER — Encounter: Payer: Self-pay | Admitting: Dermatology

## 2022-02-18 ENCOUNTER — Ambulatory Visit: Payer: BC Managed Care – PPO | Admitting: Internal Medicine

## 2022-10-10 ENCOUNTER — Ambulatory Visit: Payer: BC Managed Care – PPO | Admitting: Family Medicine

## 2022-10-10 ENCOUNTER — Other Ambulatory Visit: Payer: Self-pay

## 2022-10-10 ENCOUNTER — Encounter: Payer: Self-pay | Admitting: Family Medicine

## 2022-10-10 VITALS — BP 140/90 | HR 95 | Temp 98.0°F | Ht 71.0 in | Wt 205.0 lb

## 2022-10-10 DIAGNOSIS — M25461 Effusion, right knee: Secondary | ICD-10-CM | POA: Diagnosis not present

## 2022-10-10 DIAGNOSIS — M109 Gout, unspecified: Secondary | ICD-10-CM

## 2022-10-10 DIAGNOSIS — R030 Elevated blood-pressure reading, without diagnosis of hypertension: Secondary | ICD-10-CM

## 2022-10-10 DIAGNOSIS — M1A9XX0 Chronic gout, unspecified, without tophus (tophi): Secondary | ICD-10-CM | POA: Diagnosis not present

## 2022-10-10 DIAGNOSIS — M25561 Pain in right knee: Secondary | ICD-10-CM | POA: Diagnosis not present

## 2022-10-10 DIAGNOSIS — J309 Allergic rhinitis, unspecified: Secondary | ICD-10-CM

## 2022-10-10 MED ORDER — FLUTICASONE PROPIONATE 50 MCG/ACT NA SUSP: 2.0000 | Freq: Every day | NASAL | 0 refills | Status: DC

## 2022-10-10 MED ORDER — INDOMETHACIN 50 MG PO CAPS: ORAL_CAPSULE | ORAL | 3 refills | Status: DC

## 2022-10-10 NOTE — Telephone Encounter (Signed)
Prescription Request  10/10/2022  Is this a "Controlled Substance" medicine? No  LOV: Visit date not found  What is the name of the medication or equipment? fluticasone (FLONASE) 50 MCG/ACT nasal spray, indomethacin (INDOCIN) 50 MG capsule, cyclobenzaprine (FLEXERIL) 10 MG tablet  Have you contacted your pharmacy to request a refill? No   Which pharmacy would you like this sent to?  [facility] [place], [address] [place] [address] Phone: [phone] Fax: [phone]    Patient notified that their request is being sent to the clinical staff for review and that they should receive a response within 2 business days.   Please advise at Mobile [phone] (mobile)   At check-out today, patient states he forgot to ask for refills during his appointment.  Patient states he is out of these medications completely.

## 2022-10-10 NOTE — Assessment & Plan Note (Signed)
Given level of discomfort and how large the effusion is I have recommended that the patient go to the [facility] for further treatment and evaluation.  Discussed they may draw some fluid off.  Discussed that it is possible that this could be gout related though he could have some underlying structural issue.  Discussed that the orthopedic office should be able to work this up and treat him appropriately.

## 2022-10-10 NOTE — Telephone Encounter (Signed)
Patient seen BY MD today can we order flonase until establish care visit with [facility] NP 11/06/22.

## 2022-10-10 NOTE — Assessment & Plan Note (Signed)
Discussed that the patient needs to have lab work for this when he follows up with his new provider.  He may need to see a rheumatologist given that he cannot take allopurinol related to elevated liver enzymes possibly from this medication.

## 2022-10-10 NOTE — Assessment & Plan Note (Signed)
Likely related to his pain.  Discussed this would be rechecked at his next visit.

## 2022-10-10 NOTE — Progress Notes (Signed)
Tommi Rumps, MD Phone: [phone]  James Andrews is a 42 y.o. male who presents today for same-day visit.  Right knee pain: Patient notes pain and swelling in his right knee for about a week.  He notes 4 years ago he injured the knee and had an MRI that did not reveal any damage.  He notes he traveled and was walking a lot and had lots of trouble walking because the knee was swelling and having discomfort.  He has a hard time bearing weight on it and has been using crutches.  The pain is 10 out of 10 at its worst.  He has tried NSAIDs and muscle relaxers with little benefit.  He feels as though the muscles around his knees are tight.  He does have a history of gout though this feels different than his gout.  His gout typically occurs in his feet.  He does note a recent gout flare in his feet.  He is no longer on allopurinol given the possibility that this caused elevated liver enzymes.  Elevated blood pressure: Patient notes he is not taking medication for his blood pressure.  Social History   Tobacco Use  Smoking Status Former  Smokeless Tobacco Former   Types: Chew   Quit date: 06/25/2017    Current Outpatient Medications on File Prior to Visit  Medication Sig Dispense Refill   allopurinol (ZYLOPRIM) 300 MG tablet Take 1 tablet (300 mg total) by mouth daily. D/c uloric 40 mg daily d/c 200 mg daily 90 tablet 3   amLODipine (NORVASC) 2.5 MG tablet Take 1 tablet (2.5 mg total) by mouth daily. 90 tablet 3   Black Pepper-Turmeric (TURMERIC COMPLEX/BLACK PEPPER PO) Take by mouth.     cyclobenzaprine (FLEXERIL) 10 MG tablet TAKE 1 TABLET(10 MG) BY MOUTH DAILY AS NEEDED FOR MUSCLE SPASMS. DISCONTINUE BACLOFEN 10 DUE TO COST 30 tablet 11   fluticasone (FLONASE) 50 MCG/ACT nasal spray Place 2 sprays into both nostrils daily. 16 g 11   ketoconazole (NIZORAL) 2 % shampoo Apply 1 application topically 2 (two) times a week. Lather and let sit x 5 minutes 120 mL 11   Multiple Vitamin (MULTIVITAMIN)  tablet Take 1 tablet by mouth daily.     No current facility-administered medications on file prior to visit.     ROS see history of present illness  Objective  Physical Exam Vitals:   10/10/22 1137  BP: (!) 140/90  Pulse: 95  Temp: 98 F (36.7 C)  SpO2: 97%    BP Readings from Last 3 Encounters:  10/10/22 (!) 140/90  07/25/21 (!) 134/96  11/23/20 (!) 142/94   Wt Readings from Last 3 Encounters:  10/10/22 205 lb (93 kg)  07/25/21 216 lb 6.4 oz (98.2 kg)  11/23/20 214 lb 6.4 oz (97.3 kg)    Physical Exam Constitutional:      General: He is not in acute distress.    Appearance: He is not diaphoretic.  Cardiovascular:     Rate and Rhythm: Normal rate and regular rhythm.     Heart sounds: Normal heart sounds.  Pulmonary:     Effort: Pulmonary effort is normal.     Breath sounds: Normal breath sounds.  Musculoskeletal:     Comments: Significant effusion of the right knee with tenderness superior to the knee joint, no warmth or erythema of the right knee joint, left knee with no tenderness, swelling, warmth, or erythema, patient appears to have a Baker's cyst on palpation of his posterior right knee  Skin:    General: Skin is warm and dry.  Neurological:     Mental Status: He is alert.      Assessment/Plan: Please see individual problem list.  Acute pain of right knee Assessment & Plan: Given level of discomfort and how large the effusion is I have recommended that the patient go to the [facility] for further treatment and evaluation.  Discussed they may draw some fluid off.  Discussed that it is possible that this could be gout related though he could have some underlying structural issue.  Discussed that the orthopedic office should be able to work this up and treat him appropriately.   Gout, unspecified cause, unspecified chronicity, unspecified site Assessment & Plan: Discussed that the patient needs to have lab work for this when he  follows up with his new provider.  He may need to see a rheumatologist given that he cannot take allopurinol related to elevated liver enzymes possibly from this medication.   Chronic gout without tophus, unspecified cause, unspecified site Assessment & Plan: Discussed that the patient needs to have lab work for this when he follows up with his new provider.  He may need to see a rheumatologist given that he cannot take allopurinol related to elevated liver enzymes possibly from this medication.  Orders: -     Indomethacin; TAKE ONE CAPSULE BY MOUTH 3 TIMES A DAY WITH FOOD PRN GOUT FLARE **MAY STOP WHEN PAIN RESOLVED**  Dispense: 90 capsule; Refill: 3  Elevated blood pressure reading Assessment & Plan: Likely related to his pain.  Discussed this would be rechecked at his next visit.     No follow-ups on file.   Tommi Rumps, MD [place]

## 2022-10-23 DIAGNOSIS — M25461 Effusion, right knee: Secondary | ICD-10-CM | POA: Diagnosis not present

## 2022-10-23 DIAGNOSIS — M25561 Pain in right knee: Secondary | ICD-10-CM | POA: Diagnosis not present

## 2022-11-05 NOTE — Progress Notes (Unsigned)
  [name], NP-C Phone: [phone]  James Andrews is a 42 y.o. male who presents today for transfer of care.   ***  Social History   Tobacco Use  Smoking Status Former  Smokeless Tobacco Former   Types: Chew   Quit date: 06/25/2017    Current Outpatient Medications on File Prior to Visit  Medication Sig Dispense Refill   allopurinol (ZYLOPRIM) 300 MG tablet Take 1 tablet (300 mg total) by mouth daily. D/c uloric 40 mg daily d/c 200 mg daily 90 tablet 3   amLODipine (NORVASC) 2.5 MG tablet Take 1 tablet (2.5 mg total) by mouth daily. 90 tablet 3   Black Pepper-Turmeric (TURMERIC COMPLEX/BLACK PEPPER PO) Take by mouth.     cyclobenzaprine (FLEXERIL) 10 MG tablet TAKE 1 TABLET(10 MG) BY MOUTH DAILY AS NEEDED FOR MUSCLE SPASMS. DISCONTINUE BACLOFEN 10 DUE TO COST 30 tablet 11   fluticasone (FLONASE) 50 MCG/ACT nasal spray Place 2 sprays into both nostrils daily. 16 g 0   indomethacin (INDOCIN) 50 MG capsule TAKE ONE CAPSULE BY MOUTH 3 TIMES A DAY WITH FOOD PRN GOUT FLARE **MAY STOP WHEN PAIN RESOLVED** 90 capsule 3   ketoconazole (NIZORAL) 2 % shampoo Apply 1 application topically 2 (two) times a week. Lather and let sit x 5 minutes 120 mL 11   Multiple Vitamin (MULTIVITAMIN) tablet Take 1 tablet by mouth daily.     No current facility-administered medications on file prior to visit.     ROS see history of present illness  Objective  Physical Exam There were no vitals filed for this visit.  BP Readings from Last 3 Encounters:  10/10/22 (!) 140/90  07/25/21 (!) 134/96  11/23/20 (!) 142/94   Wt Readings from Last 3 Encounters:  10/10/22 205 lb (93 kg)  07/25/21 216 lb 6.4 oz (98.2 kg)  11/23/20 214 lb 6.4 oz (97.3 kg)    Physical Exam   Assessment/Plan: Please see individual problem list.  There are no diagnoses linked to this encounter.   Health Maintenance: ***  No follow-ups on file.   [name], NP-C [place]

## 2022-11-06 ENCOUNTER — Ambulatory Visit: Payer: BC Managed Care – PPO | Admitting: Nurse Practitioner

## 2022-11-06 ENCOUNTER — Encounter: Payer: Self-pay | Admitting: Nurse Practitioner

## 2022-11-06 VITALS — BP 124/96 | HR 107 | Temp 97.8°F | Ht 71.0 in | Wt 200.0 lb

## 2022-11-06 DIAGNOSIS — Z0000 Encounter for general adult medical examination without abnormal findings: Secondary | ICD-10-CM

## 2022-11-06 DIAGNOSIS — R7989 Other specified abnormal findings of blood chemistry: Secondary | ICD-10-CM | POA: Insufficient documentation

## 2022-11-06 DIAGNOSIS — R030 Elevated blood-pressure reading, without diagnosis of hypertension: Secondary | ICD-10-CM

## 2022-11-06 DIAGNOSIS — K760 Fatty (change of) liver, not elsewhere classified: Secondary | ICD-10-CM | POA: Diagnosis not present

## 2022-11-06 DIAGNOSIS — Z1329 Encounter for screening for other suspected endocrine disorder: Secondary | ICD-10-CM

## 2022-11-06 DIAGNOSIS — Z13220 Encounter for screening for lipoid disorders: Secondary | ICD-10-CM

## 2022-11-06 DIAGNOSIS — E559 Vitamin D deficiency, unspecified: Secondary | ICD-10-CM

## 2022-11-06 DIAGNOSIS — R7303 Prediabetes: Secondary | ICD-10-CM | POA: Diagnosis not present

## 2022-11-06 NOTE — Assessment & Plan Note (Signed)
Followed by GI. Has lost 20 lbs over the last year. Will monitor.

## 2022-11-06 NOTE — Assessment & Plan Note (Signed)
Ferritin level in January 2023- 544. Will recheck.

## 2022-11-06 NOTE — Assessment & Plan Note (Addendum)
Chronic. Stable with diet control. Will check A1c. Encouraged to continue healthy diet and exercise.

## 2022-11-06 NOTE — Assessment & Plan Note (Signed)
Physical exam complete. Lab work as outlined. Will contact patient with results. Due for Tdap, patient will return for this. Continue healthy diet and exercise.

## 2022-11-06 NOTE — Assessment & Plan Note (Signed)
Elevated reading today x 2. Elevated reading at last visit 10/10/22- 140/90. Patient is not on any medications for hypertension. Has been prescribed Norvasc 2.5 mg in the past and never took it. He does not want to start on medications today, he believes it is due to "white coat syndrome." Patient agreeable to return in one month for follow up on BP, he will check it daily at home and keep a log to bring to the appointment. If elevated at that time, will start on medication.

## 2022-11-09 IMAGING — US US ABDOMEN LIMITED W/ ELASTOGRAPHY
1 series · 12 of 25 positions shown · non-contrast
Comparison: None.

CLINICAL DATA: Fatty liver disease

EXAM:
US ABDOMEN LIMITED - RIGHT UPPER QUADRANT
ULTRASOUND HEPATIC ELASTOGRAPHY
TECHNIQUE: Sonography of the right upper quadrant was performed. In addition,
ultrasound elastography evaluation of the liver was performed. A
region of interest was placed within the right lobe of the liver.
Following application of a compressive sonographic pulse, tissue
compressibility was assessed. Multiple assessments were performed at
the selected site. Median tissue compressibility was determined.
Previously, hepatic stiffness was assessed by shear wave velocity.
Based on recently published Society of Radiologists in Ultrasound
consensus article, reporting is now recommended to be performed in
the SI units of pressure (kiloPascals) representing hepatic
stiffness/elasticity. The obtained result is compared to the
published reference standards. (cACLD = compensated Advanced Chronic
Liver Disease)

[Series 1: us abdomen ruq w/elastography · 12 of 48 slices shown]
[im 2/48]
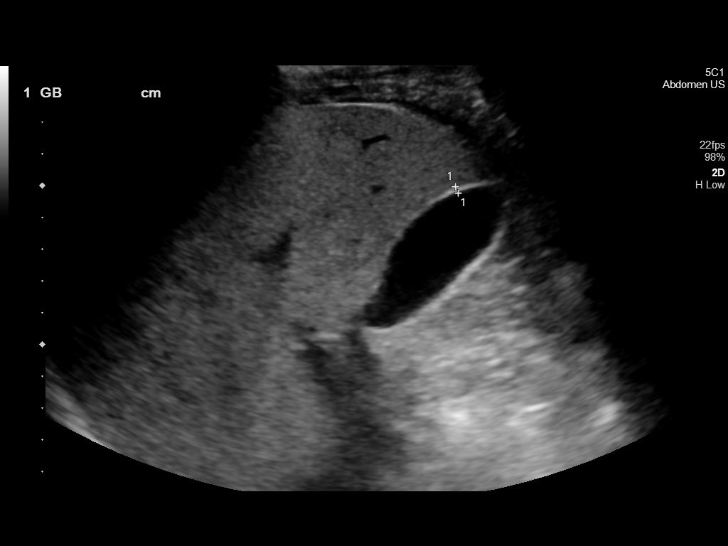
[im 6/48]
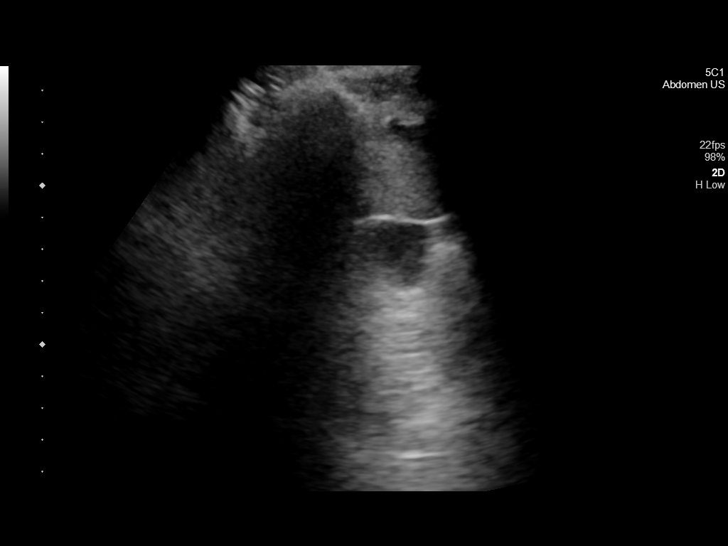
[im 10/48]
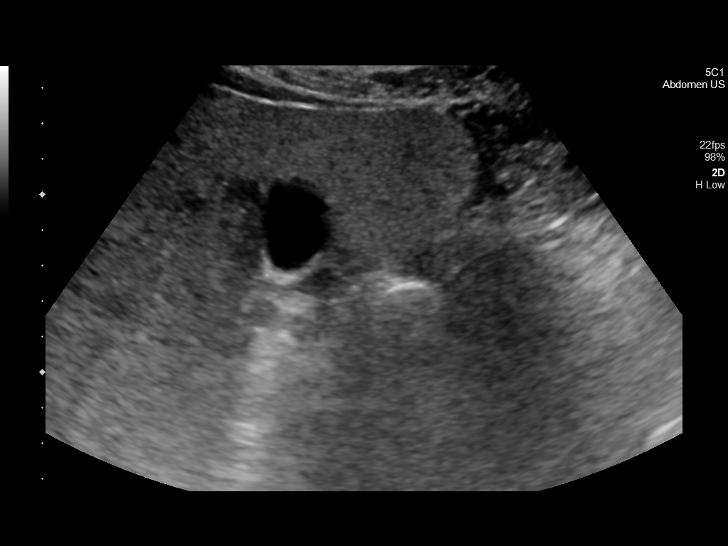
[im 14/48]
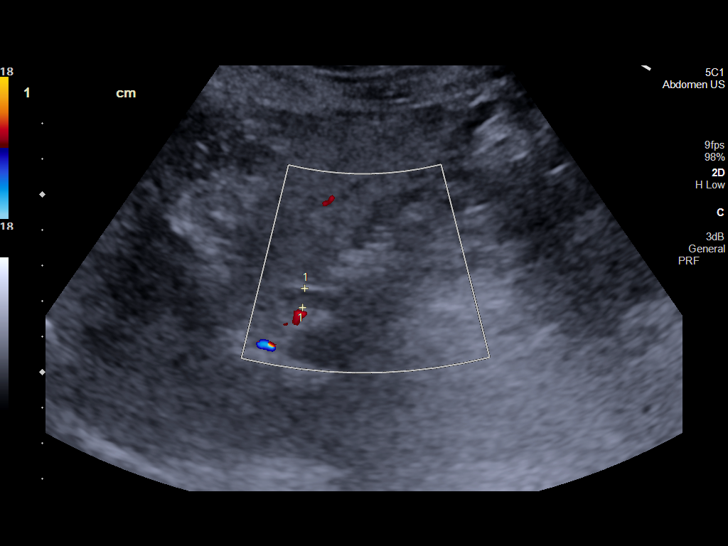
[im 18/48]
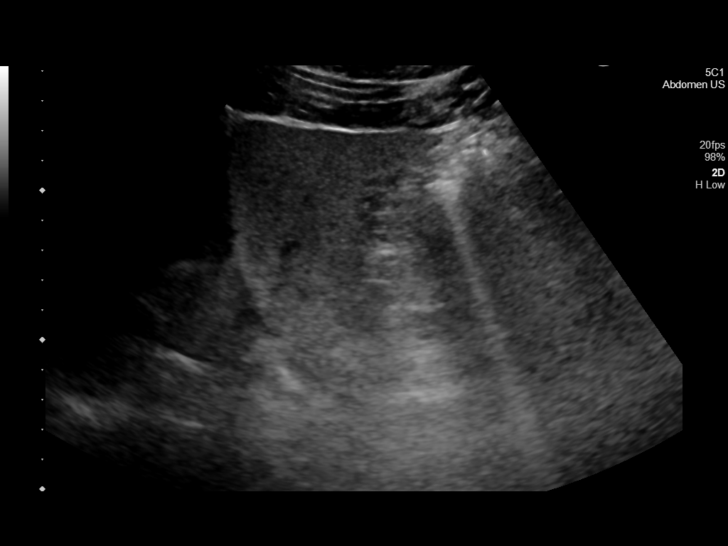
[im 22/48]
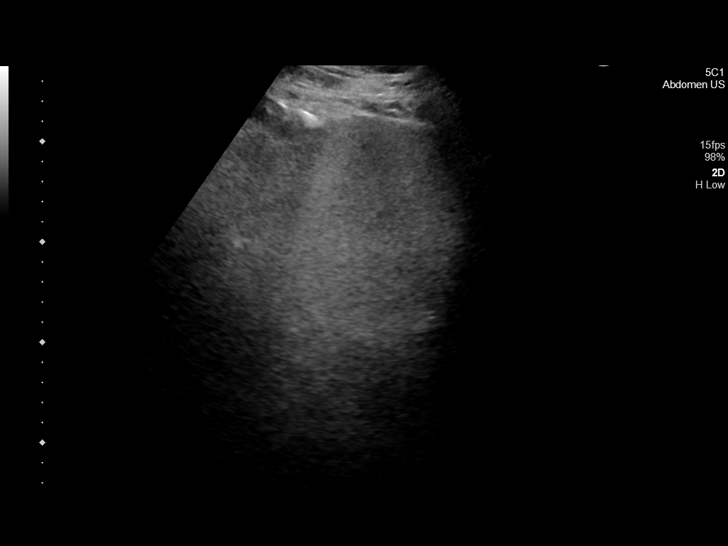
[im 26/48]
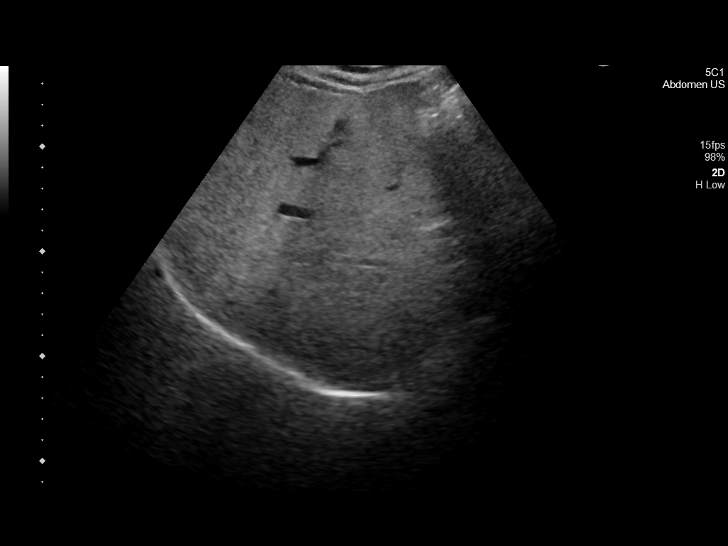
[im 30/48]
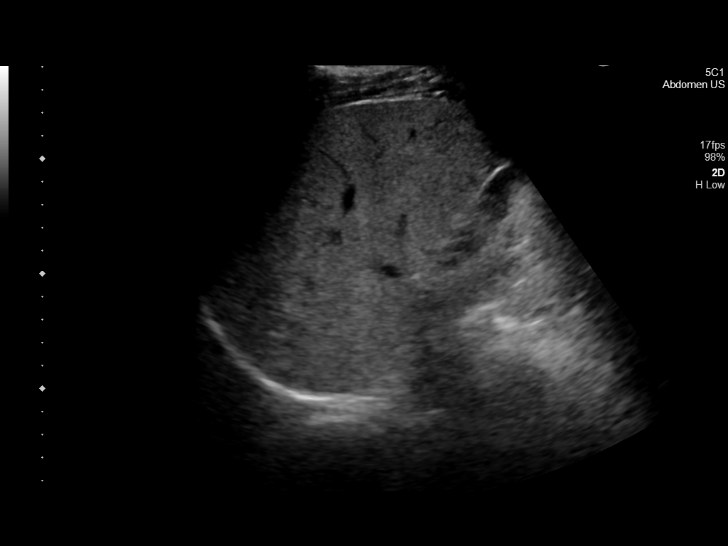
[im 34/48]
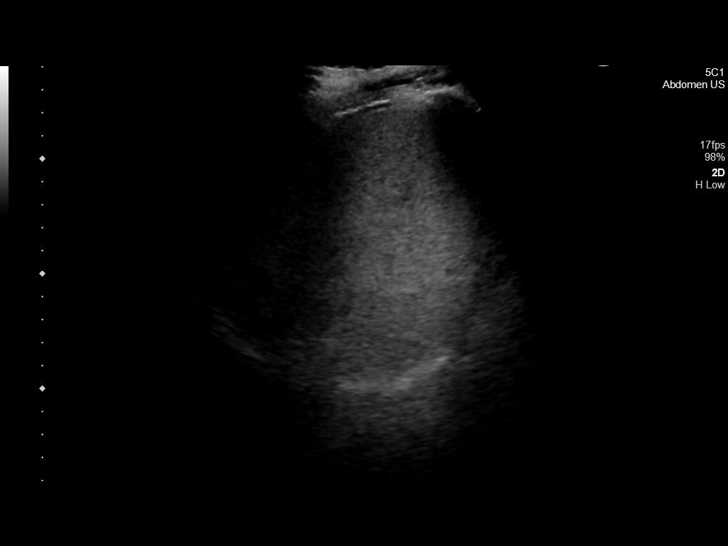
[im 38/48]
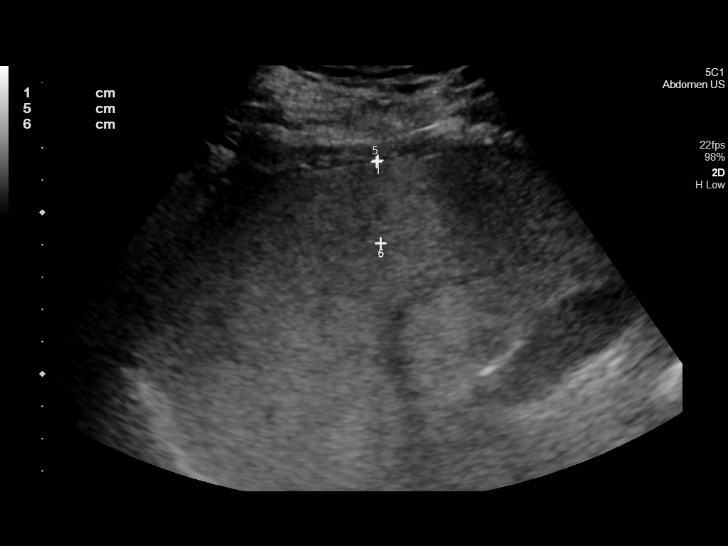
[im 42/48]
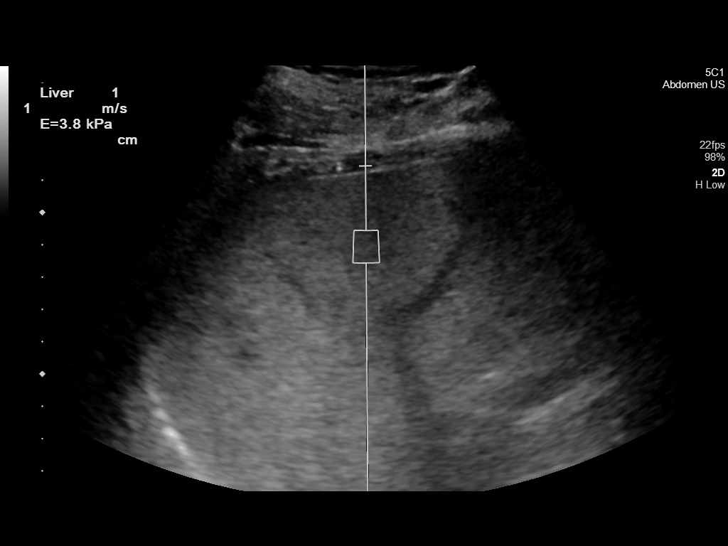
[im 46/48]
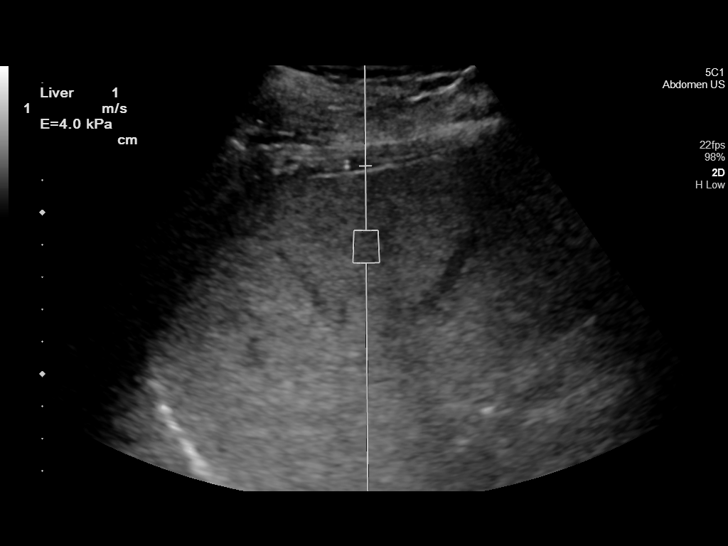

[12 of 25 positions shown; findings below may reference images not displayed]

FINDINGS: ULTRASOUND ABDOMEN LIMITED RIGHT UPPER QUADRANT

Gallbladder:

No gallstones or wall thickening visualized. No sonographic Murphy
sign noted.

Common bile duct:

Diameter: 5 mm

Liver:

Diffusely increased parenchymal echogenicity with some focal sparing
along the gallbladder fossa. Portal vein is patent on color Doppler
imaging with normal direction of blood flow towards the liver.

ULTRASOUND HEPATIC ELASTOGRAPHY

Device: Siemens Helix VTQ

Patient position: Supine

Transducer 5C1

Number of measurements: 10

Hepatic segment:  8

Median kPa:

IQR:

IQR/Median kPa ratio:

Data quality:  Good

Diagnostic category:  < or = 5 kPa: high probability of being normal

The use of hepatic elastography is applicable to patients with viral
hepatitis and non-alcoholic fatty liver disease. At this time, there
is insufficient data for the referenced cut-off values and use in
other causes of liver disease, including alcoholic liver disease.
Patients, however, may be assessed by elastography and serve as
their own reference standard/baseline.

In patients with non-alcoholic liver disease, the values suggesting
compensated advanced chronic liver disease (cACLD) may be lower, and
patients may need additional testing with elasticity results of [DATE]
kPa.

Please note that abnormal hepatic elasticity and shear wave
velocities may also be identified in clinical settings other than
with hepatic fibrosis, such as: acute hepatitis, elevated right
heart and central venous pressures including use of beta blockers,
Willi disease ([place]), infiltrative processes such as
mastocytosis/amyloidosis/infiltrative tumor/lymphoma, extrahepatic
cholestasis, with hyperemia in the post-prandial state, and with
liver transplantation. Correlation with patient history, laboratory
data, and clinical condition recommended.

Diagnostic Categories:

< or =5 kPa: high probability of being normal

< or =9 kPa: in the absence of other known clinical signs, rules [DATE] kPa and ?13 kPa: suggestive of cACLD, but needs further testing

>13 kPa: highly suggestive of cACLD

> or =17 kPa: highly suggestive of cACLD with an increased
probability of clinically significant portal hypertension
IMPRESSION: ULTRASOUND RUQ:

Hepatic steatosis with focal fatty sparing along the gallbladder
fossa.

ULTRASOUND HEPATIC ELASTOGRAPHY:

Median kPa:

Diagnostic category:  < or = 5 kPa: high probability of being normal

## 2022-12-02 ENCOUNTER — Other Ambulatory Visit (INDEPENDENT_AMBULATORY_CARE_PROVIDER_SITE_OTHER): Payer: BC Managed Care – PPO

## 2022-12-02 DIAGNOSIS — E559 Vitamin D deficiency, unspecified: Secondary | ICD-10-CM | POA: Diagnosis not present

## 2022-12-02 DIAGNOSIS — Z1329 Encounter for screening for other suspected endocrine disorder: Secondary | ICD-10-CM

## 2022-12-02 DIAGNOSIS — Z0000 Encounter for general adult medical examination without abnormal findings: Secondary | ICD-10-CM | POA: Diagnosis not present

## 2022-12-02 DIAGNOSIS — R7989 Other specified abnormal findings of blood chemistry: Secondary | ICD-10-CM | POA: Diagnosis not present

## 2022-12-02 DIAGNOSIS — R7303 Prediabetes: Secondary | ICD-10-CM

## 2022-12-02 DIAGNOSIS — Z13220 Encounter for screening for lipoid disorders: Secondary | ICD-10-CM | POA: Diagnosis not present

## 2022-12-02 LAB — CBC WITH DIFFERENTIAL/PLATELET
Basophils Absolute: 0 10*3/uL (ref 0.0–0.1)
Basophils Relative: 0.3 % (ref 0.0–3.0)
Eosinophils Absolute: 0.3 10*3/uL (ref 0.0–0.7)
Eosinophils Relative: 5 % (ref 0.0–5.0)
HCT: 40.9 % (ref 39.0–52.0)
Hemoglobin: 13.6 g/dL (ref 13.0–17.0)
Lymphocytes Relative: 21.9 % (ref 12.0–46.0)
Lymphs Abs: 1.3 10*3/uL (ref 0.7–4.0)
MCHC: 33.2 g/dL (ref 30.0–36.0)
MCV: 92.3 fl (ref 78.0–100.0)
Monocytes Absolute: 0.6 10*3/uL (ref 0.1–1.0)
Monocytes Relative: 9.7 % (ref 3.0–12.0)
Neutro Abs: 3.6 10*3/uL (ref 1.4–7.7)
Neutrophils Relative %: 63.1 % (ref 43.0–77.0)
Platelets: 264 10*3/uL (ref 150.0–400.0)
RBC: 4.43 Mil/uL (ref 4.22–5.81)
RDW: 14.9 % (ref 11.5–15.5)
WBC: 5.7 10*3/uL (ref 4.0–10.5)

## 2022-12-02 LAB — COMPREHENSIVE METABOLIC PANEL
ALT: 27 U/L (ref 0–53)
AST: 20 U/L (ref 0–37)
Albumin: 4.2 g/dL (ref 3.5–5.2)
Alkaline Phosphatase: 61 U/L (ref 39–117)
BUN: 17 mg/dL (ref 6–23)
CO2: 31 mEq/L (ref 19–32)
Calcium: 9.9 mg/dL (ref 8.4–10.5)
Chloride: 99 mEq/L (ref 96–112)
Creatinine, Ser: 0.96 mg/dL (ref 0.40–1.50)
GFR: 97.61 mL/min (ref >60.00)
Glucose, Bld: 98 mg/dL (ref 70–99)
Potassium: 4.4 mEq/L (ref 3.5–5.1)
Sodium: 138 mEq/L (ref 135–145)
Total Bilirubin: 0.8 mg/dL (ref 0.2–1.2)
Total Protein: 7.2 g/dL (ref 6.0–8.3)

## 2022-12-02 LAB — LIPID PANEL
Cholesterol: 201 mg/dL — ABNORMAL HIGH (ref 0–200)
HDL: 65.9 mg/dL (ref >39.00)
LDL Cholesterol: 126 mg/dL — ABNORMAL HIGH (ref 0–99)
NonHDL: 135.5
Total CHOL/HDL Ratio: 3
Triglycerides: 48 mg/dL (ref 0.0–149.0)
VLDL: 9.6 mg/dL (ref 0.0–40.0)

## 2022-12-02 LAB — IBC + FERRITIN
Ferritin: 248.6 ng/mL (ref 22.0–322.0)
Iron: 103 ug/dL (ref 42–165)
Saturation Ratios: 29.9 % (ref 20.0–50.0)
TIBC: 344.4 ug/dL (ref 250.0–450.0)
Transferrin: 246 mg/dL (ref 212.0–360.0)

## 2022-12-02 LAB — TSH: TSH: 2.88 u[IU]/mL (ref 0.35–5.50)

## 2022-12-02 LAB — HEMOGLOBIN A1C: Hgb A1c MFr Bld: 5.9 % (ref 4.6–6.5)

## 2022-12-02 LAB — VITAMIN D 25 HYDROXY (VIT D DEFICIENCY, FRACTURES): VITD: 15.51 ng/mL — ABNORMAL LOW (ref 30.00–100.00)

## 2022-12-03 ENCOUNTER — Telehealth: Payer: Self-pay

## 2022-12-03 NOTE — Telephone Encounter (Signed)
Called pt to go over meds and confirm pharmacy before appt on tomorrow.

## 2022-12-03 NOTE — Progress Notes (Unsigned)
  [name], NP-C Phone: [phone]  James Andrews is a 42 y.o. male who presents today for blood pressure follow up and to review labs.      Social History   Tobacco Use  Smoking Status Former  Smokeless Tobacco Former   Types: Chew   Quit date: 06/25/2017    Current Outpatient Medications on File Prior to Visit  Medication Sig Dispense Refill   Black Pepper-Turmeric (TURMERIC COMPLEX/BLACK PEPPER PO) Take by mouth.     cyclobenzaprine (FLEXERIL) 10 MG tablet TAKE 1 TABLET(10 MG) BY MOUTH DAILY AS NEEDED FOR MUSCLE SPASMS. DISCONTINUE BACLOFEN 10 DUE TO COST 30 tablet 11   fluticasone (FLONASE) 50 MCG/ACT nasal spray Place 2 sprays into both nostrils daily. 16 g 0   indomethacin (INDOCIN) 50 MG capsule TAKE ONE CAPSULE BY MOUTH 3 TIMES A DAY WITH FOOD PRN GOUT FLARE **MAY STOP WHEN PAIN RESOLVED** 90 capsule 3   Multiple Vitamin (MULTIVITAMIN) tablet Take 1 tablet by mouth daily.     No current facility-administered medications on file prior to visit.     ROS see history of present illness  Objective  Physical Exam There were no vitals filed for this visit.  BP Readings from Last 3 Encounters:  11/06/22 (!) 124/96  10/10/22 (!) 140/90  07/25/21 (!) 134/96   Wt Readings from Last 3 Encounters:  11/06/22 200 lb (90.7 kg)  10/10/22 205 lb (93 kg)  07/25/21 216 lb 6.4 oz (98.2 kg)    Physical Exam   Assessment/Plan: Please see individual problem list.  There are no diagnoses linked to this encounter.   Health Maintenance: ***  No follow-ups on file.   [name], NP-C [place]

## 2022-12-04 ENCOUNTER — Encounter: Payer: Self-pay | Admitting: Nurse Practitioner

## 2022-12-04 ENCOUNTER — Ambulatory Visit: Payer: BC Managed Care – PPO | Admitting: Nurse Practitioner

## 2022-12-04 VITALS — BP 138/98 | HR 106 | Temp 98.7°F | Ht 71.0 in | Wt 201.2 lb

## 2022-12-04 DIAGNOSIS — I10 Essential (primary) hypertension: Secondary | ICD-10-CM | POA: Insufficient documentation

## 2022-12-04 DIAGNOSIS — E559 Vitamin D deficiency, unspecified: Secondary | ICD-10-CM

## 2022-12-04 DIAGNOSIS — G8929 Other chronic pain: Secondary | ICD-10-CM

## 2022-12-04 DIAGNOSIS — M545 Low back pain, unspecified: Secondary | ICD-10-CM | POA: Insufficient documentation

## 2022-12-04 DIAGNOSIS — R7303 Prediabetes: Secondary | ICD-10-CM | POA: Diagnosis not present

## 2022-12-04 DIAGNOSIS — R7989 Other specified abnormal findings of blood chemistry: Secondary | ICD-10-CM

## 2022-12-04 DIAGNOSIS — J309 Allergic rhinitis, unspecified: Secondary | ICD-10-CM

## 2022-12-04 DIAGNOSIS — K760 Fatty (change of) liver, not elsewhere classified: Secondary | ICD-10-CM

## 2022-12-04 MED ORDER — CYCLOBENZAPRINE HCL 10 MG PO TABS: ORAL_TABLET | ORAL | 2 refills | Status: DC

## 2022-12-04 MED ORDER — FLUTICASONE PROPIONATE 50 MCG/ACT NA SUSP: 2.0000 | Freq: Every day | NASAL | 1 refills | Status: DC

## 2022-12-04 MED ORDER — LOSARTAN POTASSIUM 25 MG PO TABS: 25.0000 mg | ORAL_TABLET | Freq: Every day | ORAL | 3 refills | Status: DC

## 2022-12-04 NOTE — Assessment & Plan Note (Addendum)
Chronic. Stable. A1c- 5.9. Encouraged healthy diet and exercise. Counseled on prediabetic diet, avoiding carbs and increasing vegetables. Dietary information provided to patient.

## 2022-12-04 NOTE — Assessment & Plan Note (Addendum)
Vitamin D level- 15.51. Encouraged patient to start OTC 4000 unit daily dose. Will monitor.

## 2022-12-04 NOTE — Assessment & Plan Note (Addendum)
BP remained elevated x 2 in office- 138/100 and 138/98. He continues to have elevated readings at home. Will start patient on Losartan 25 mg daily. He is going to check his blood pressure daily and keep a log. He will return in 2 weeks for a blood pressure check. Will monitor. Encouraged decreasing sodium intake and heart healthy diet. DASH diet information provided to patient.

## 2022-12-04 NOTE — Assessment & Plan Note (Signed)
Liver enzymes returned to normal- AST: 20 and ALT: 27. Encouraged healthy diet and exercise. Will continue to monitor.

## 2022-12-04 NOTE — Assessment & Plan Note (Addendum)
Ferritin level returned to normal- 248. Iron panel WNL. Will continue to monitor. Will refer to Hematology if increasing for further evaluation.

## 2022-12-18 ENCOUNTER — Ambulatory Visit (INDEPENDENT_AMBULATORY_CARE_PROVIDER_SITE_OTHER): Payer: BC Managed Care – PPO

## 2022-12-18 VITALS — BP 134/80 | HR 113

## 2022-12-18 DIAGNOSIS — I10 Essential (primary) hypertension: Secondary | ICD-10-CM

## 2022-12-18 NOTE — Progress Notes (Signed)
Pt presented today for bp check. Pt stated that he was started on Losartan 25 mg about two weeks ago but just started the medication about 5 days ago. He has been taking it daily at night. In the office today his bp in the right arm was 130/82 HR 107 and in the lef tar 134/80 HR 113. Pt was advised that he was good to go and if any changes needed to be made we would give him a call. Pt gave a verbal understanding.

## 2022-12-27 ENCOUNTER — Other Ambulatory Visit: Payer: Self-pay | Admitting: Nurse Practitioner

## 2022-12-27 DIAGNOSIS — J309 Allergic rhinitis, unspecified: Secondary | ICD-10-CM

## 2023-01-12 DIAGNOSIS — S20411A Abrasion of right back wall of thorax, initial encounter: Secondary | ICD-10-CM | POA: Diagnosis not present

## 2023-01-12 DIAGNOSIS — L089 Local infection of the skin and subcutaneous tissue, unspecified: Secondary | ICD-10-CM | POA: Diagnosis not present

## 2023-12-31 ENCOUNTER — Other Ambulatory Visit: Payer: Self-pay | Admitting: Nurse Practitioner

## 2023-12-31 ENCOUNTER — Ambulatory Visit: Payer: Self-pay

## 2023-12-31 DIAGNOSIS — M1A9XX0 Chronic gout, unspecified, without tophus (tophi): Secondary | ICD-10-CM

## 2023-12-31 NOTE — Telephone Encounter (Signed)
 Chief Complaint: left foot pain with gout flare up Symptoms:  see above Frequency: since the weekend Pertinent Negatives: Patient denies n/a Disposition: [] ED /[x] Urgent Care (no appt availability in office) / [] Appointment(In office/virtual)/ []  [facility]/ [] Home Care/ [x] Refused Recommended Disposition /[] [facility]/ [x]  Follow-up with PCP Additional Notes: Patient called stating he has been experiencing a gout flare up since the weekend. Patient states he called in Tuesday for a refill of his Indomethacin and was told it would be ready by Thursday. Patient would like urgent refill put in for asap tomorrow morning to the [facility] on his profile, and is asking for a 90 day supply. Patient offered urgent care appt and refused.    Copied from CRM [id number]. Topic: Clinical - Red Word Triage >> Dec 31, 2023  5:27 PM Abigail D wrote: Red Word that prompted transfer to Nurse Triage: Gout left foot, pain is 10/10. Reason for Disposition  [1] SEVERE pain (e.g., excruciating, unable to do any normal activities) AND [2] not improved after 2 hours of pain medicine  Answer Assessment - Initial Assessment Questions 1. ONSET: "When did the pain start?"      Last weekend 2. LOCATION: "Where is the pain located?"      Left foot and left ankle 3. PAIN: "How bad is the pain?"    (Scale 1-10; or mild, moderate, severe)  - MILD (1-3): doesn't interfere with normal activities.   - MODERATE (4-7): interferes with normal activities (e.g., work or school) or awakens from sleep, limping.   - SEVERE (8-10): excruciating pain, unable to do any normal activities, unable to walk.      10 4. WORK OR EXERCISE: "Has there been any recent work or exercise that involved this part of the body?"      No 5. CAUSE: "What do you think is causing the foot pain?"     Gout flare up 6. OTHER SYMPTOMS: "Do you have any other symptoms?" (e.g., leg pain, rash, fever, numbness)     No  Protocols used: Foot  Pain-A-AH

## 2023-12-31 NOTE — Telephone Encounter (Signed)
 Copied from CRM [id number]. Topic: Clinical - Medication Refill >> Dec 31, 2023  3:01 PM Fonda Kinder J wrote: Most Recent Primary Care Visit:  Provider: Sandy Salaam  Department: [facility]  Visit Type: NURSE VISIT  Date: 12/18/2022  Medication: indomethacin (INDOCIN) 50 MG capsule  Has the patient contacted their pharmacy? Yes (Agent: If no, request that the patient contact the pharmacy for the refill. If patient does not wish to contact the pharmacy document the reason why and proceed with request.) (Agent: If yes, when and what did the pharmacy advise?)  Is this the correct pharmacy for this prescription? Yes If no, delete pharmacy and type the correct one.  This is the patient's preferred pharmacy:  [id number] - [address] [place] [address] Phone: [phone] Fax: [phone]   Has the prescription been filled recently? No  Is the patient out of the medication? Yes  Has the patient been seen for an appointment in the last year OR does the patient have an upcoming appointment? No  Can we respond through MyChart? Yes  Agent: Please be advised that Rx refills may take up to 3 business days. We ask that you follow-up with your pharmacy.

## 2024-01-01 MED ORDER — INDOMETHACIN 50 MG PO CAPS: ORAL_CAPSULE | ORAL | 3 refills | Status: DC

## 2024-01-28 ENCOUNTER — Telehealth: Payer: Self-pay

## 2024-01-28 ENCOUNTER — Encounter: Admitting: Nurse Practitioner

## 2024-01-28 ENCOUNTER — Other Ambulatory Visit: Payer: Self-pay

## 2024-01-28 DIAGNOSIS — I10 Essential (primary) hypertension: Secondary | ICD-10-CM

## 2024-01-28 MED ORDER — LOSARTAN POTASSIUM 25 MG PO TABS: 25.0000 mg | ORAL_TABLET | Freq: Every day | ORAL | 3 refills | Status: DC

## 2024-01-28 NOTE — Telephone Encounter (Signed)
 Refill was sent in on today

## 2024-01-28 NOTE — Telephone Encounter (Signed)
 Patient was late for his appointment today for his physical, so we had to reschedule.  Patient states he took his last blood pressure pill today.  Prescription Request  01/28/2024  LOV: Visit date not found  What is the name of the medication or equipment?  losartan  (COZAAR ) 25 MG tablet   Have you contacted your pharmacy to request a refill? No   Which pharmacy would you like this sent to?  [name] [address] [place] [address] Phone: [phone] Fax: [phone]    Patient notified that their request is being sent to the clinical staff for review and that they should receive a response within 2 business days.   Please advise at Mobile [phone] (mobile)

## 2024-02-19 ENCOUNTER — Encounter: Admitting: Nurse Practitioner

## 2024-02-19 ENCOUNTER — Telehealth: Payer: Self-pay | Admitting: Nurse Practitioner

## 2024-02-19 ENCOUNTER — Other Ambulatory Visit: Payer: Self-pay | Admitting: Nurse Practitioner

## 2024-02-19 DIAGNOSIS — G8929 Other chronic pain: Secondary | ICD-10-CM

## 2024-02-19 DIAGNOSIS — I10 Essential (primary) hypertension: Secondary | ICD-10-CM

## 2024-02-19 MED ORDER — LOSARTAN POTASSIUM 25 MG PO TABS: 25.0000 mg | ORAL_TABLET | Freq: Every day | ORAL | 3 refills | Status: DC

## 2024-02-19 NOTE — Telephone Encounter (Signed)
 Prescription Request  02/19/2024  LOV: Visit date not found  Patient had appointment with provider today, but provider had to leave. Patient is out of his medication and needs a refill.   What is the name of the medication or equipment?  losartan  (COZAAR ) 25 MG tablet   Have you contacted your pharmacy to request a refill? No   Which pharmacy would you like this sent to?  [name] [address] [place] [address] Phone: [phone] Fax: [phone]    Patient notified that their request is being sent to the clinical staff for review and that they should receive a response within 2 business days.   Please advise at Mobile [phone] (mobile)

## 2024-02-19 NOTE — Telephone Encounter (Signed)
 Refill sent.

## 2024-02-19 NOTE — Telephone Encounter (Signed)
 patient

## 2024-02-29 ENCOUNTER — Ambulatory Visit: Payer: Self-pay

## 2024-02-29 ENCOUNTER — Encounter: Payer: Self-pay | Admitting: Internal Medicine

## 2024-02-29 ENCOUNTER — Ambulatory Visit (INDEPENDENT_AMBULATORY_CARE_PROVIDER_SITE_OTHER): Admitting: Internal Medicine

## 2024-02-29 VITALS — BP 126/82 | HR 115 | Temp 98.0°F | Ht 71.0 in | Wt 204.0 lb

## 2024-02-29 DIAGNOSIS — R5383 Other fatigue: Secondary | ICD-10-CM | POA: Diagnosis not present

## 2024-02-29 DIAGNOSIS — E291 Testicular hypofunction: Secondary | ICD-10-CM

## 2024-02-29 DIAGNOSIS — K760 Fatty (change of) liver, not elsewhere classified: Secondary | ICD-10-CM | POA: Diagnosis not present

## 2024-02-29 DIAGNOSIS — M109 Gout, unspecified: Secondary | ICD-10-CM | POA: Diagnosis not present

## 2024-02-29 DIAGNOSIS — E559 Vitamin D deficiency, unspecified: Secondary | ICD-10-CM | POA: Diagnosis not present

## 2024-02-29 DIAGNOSIS — R040 Epistaxis: Secondary | ICD-10-CM | POA: Insufficient documentation

## 2024-02-29 DIAGNOSIS — I10 Essential (primary) hypertension: Secondary | ICD-10-CM

## 2024-02-29 DIAGNOSIS — R000 Tachycardia, unspecified: Secondary | ICD-10-CM

## 2024-02-29 DIAGNOSIS — G5702 Lesion of sciatic nerve, left lower limb: Secondary | ICD-10-CM

## 2024-02-29 DIAGNOSIS — R7303 Prediabetes: Secondary | ICD-10-CM

## 2024-02-29 DIAGNOSIS — G5700 Lesion of sciatic nerve, unspecified lower limb: Secondary | ICD-10-CM | POA: Insufficient documentation

## 2024-02-29 DIAGNOSIS — Z0000 Encounter for general adult medical examination without abnormal findings: Secondary | ICD-10-CM | POA: Diagnosis not present

## 2024-02-29 LAB — CBC WITH DIFFERENTIAL/PLATELET
Basophils Absolute: 0 10*3/uL (ref 0.0–0.1)
Basophils Relative: 0.7 % (ref 0.0–3.0)
Eosinophils Absolute: 0.1 10*3/uL (ref 0.0–0.7)
Eosinophils Relative: 5.6 % — ABNORMAL HIGH (ref 0.0–5.0)
HCT: 39.9 % (ref 39.0–52.0)
Hemoglobin: 13.1 g/dL (ref 13.0–17.0)
Lymphocytes Relative: 36.5 % (ref 12.0–46.0)
Lymphs Abs: 1 10*3/uL (ref 0.7–4.0)
MCHC: 32.7 g/dL (ref 30.0–36.0)
MCV: 92.5 fl (ref 78.0–100.0)
Monocytes Absolute: 0.3 10*3/uL (ref 0.1–1.0)
Monocytes Relative: 10 % (ref 3.0–12.0)
Neutro Abs: 1.2 10*3/uL — ABNORMAL LOW (ref 1.4–7.7)
Neutrophils Relative %: 47.2 % (ref 43.0–77.0)
Platelets: 204 10*3/uL (ref 150.0–400.0)
RBC: 4.31 Mil/uL (ref 4.22–5.81)
RDW: 16.2 % — ABNORMAL HIGH (ref 11.5–15.5)
WBC: 2.6 10*3/uL — ABNORMAL LOW (ref 4.0–10.5)

## 2024-02-29 LAB — COMPREHENSIVE METABOLIC PANEL WITH GFR
ALT: 65 U/L — ABNORMAL HIGH (ref 0–53)
AST: 46 U/L — ABNORMAL HIGH (ref 0–37)
Albumin: 4.9 g/dL (ref 3.5–5.2)
Alkaline Phosphatase: 50 U/L (ref 39–117)
BUN: 16 mg/dL (ref 6–23)
CO2: 27 meq/L (ref 19–32)
Calcium: 9.2 mg/dL (ref 8.4–10.5)
Chloride: 100 meq/L (ref 96–112)
Creatinine, Ser: 0.85 mg/dL (ref 0.40–1.50)
GFR: 106.38 mL/min (ref >60.00)
Glucose, Bld: 95 mg/dL (ref 70–99)
Potassium: 4 meq/L (ref 3.5–5.1)
Sodium: 139 meq/L (ref 135–145)
Total Bilirubin: 0.5 mg/dL (ref 0.2–1.2)
Total Protein: 8 g/dL (ref 6.0–8.3)

## 2024-02-29 LAB — VITAMIN D 25 HYDROXY (VIT D DEFICIENCY, FRACTURES): VITD: 20.81 ng/mL — ABNORMAL LOW (ref 30.00–100.00)

## 2024-02-29 LAB — LIPID PANEL
Cholesterol: 242 mg/dL — ABNORMAL HIGH (ref 0–200)
HDL: 89.7 mg/dL (ref >39.00)
LDL Cholesterol: 144 mg/dL — ABNORMAL HIGH (ref 0–99)
NonHDL: 152.27
Total CHOL/HDL Ratio: 3
Triglycerides: 40 mg/dL (ref 0.0–149.0)
VLDL: 8 mg/dL (ref 0.0–40.0)

## 2024-02-29 LAB — HEMOGLOBIN A1C: Hgb A1c MFr Bld: 5.8 % (ref 4.6–6.5)

## 2024-02-29 LAB — TSH: TSH: 2.55 u[IU]/mL (ref 0.35–5.50)

## 2024-02-29 LAB — URIC ACID: Uric Acid, Serum: 11.1 mg/dL — ABNORMAL HIGH (ref 4.0–7.8)

## 2024-02-29 NOTE — Progress Notes (Signed)
 Complete physical exam  Patient: James Andrews   DOB: [birth year]   43 y.o. Male  MRN: [id number]  Subjective:     Chief Complaint  Patient presents with   Annual Exam    Discuss Gout medication  Sinus Infection    James Andrews is a 43 y.o. male who presents today for a complete physical exam. He reports consuming a general diet. The patient has a physically strenuous job, but has no regular exercise apart from work.  He generally feels fairly well. He reports sleeping fairly well. He does have additional problems to discuss today.   Patient states that he ran out of his antihypertensive medication approximate month ago and had it refilled this Saturday.  He has not taken his antihypertensive medication yet today.  Patient also complains of pain over his left buttock that shoots to the upper part of his left thigh.  He does carry his wallet in his back pocket but does not sit on it.  States the pain has been significant over the last month.  Has been trying sciatic stretches without improvement  Patient was complains of occasional left shoulder pain but states that he drives a lot at work and this pain comes and goes.  Pain is mainly over his left upper back when further clarified.  He denies any pain currently.  Patient does complain of recurrent gout flares for which he takes indomethacin .  He would like a preventative medication but the last time he took allopurinol  he had elevated liver enzymes.  He will follow-up with rheumatology for this  Patient also complains of recurrent epistaxis mainly from his right nostril especially with him blows his nose in the morning.  He states that he does have allergic rhinitis and this has been a bad allergy season for him.  However, this has been occurring daily for the last month.  Patient also complains of increased fatigue over the last few months.  He would like his testosterone checked today to see if that could possibly be the issue.  Denies any  other complaints at this time  Most recent fall risk assessment:    02/29/2024    8:59 AM  Fall Risk   Falls in the past year? 0  Number falls in past yr: 0  Injury with Fall? 0  Risk for fall due to : No Fall Risks  Follow up Falls evaluation completed     Most recent depression screenings:    02/29/2024    8:59 AM 11/06/2022    2:17 PM  PHQ 2/9 Scores  PHQ - 2 Score 0 0  PHQ- 9 Score 1         Patient Care Team: Bluford Burkitt, NP as PCP - General (Nurse Practitioner)   Outpatient Medications Prior to Visit  Medication Sig   Black Pepper-Turmeric (TURMERIC COMPLEX/BLACK PEPPER PO) Take by mouth.   cyclobenzaprine  (FLEXERIL ) 10 MG tablet TAKE 1 TABLET BY MOUTH DAILY AS NEEDED FOR MUSCLE SPASMS.   fluticasone  (FLONASE ) 50 MCG/ACT nasal spray SPRAY 2 SPRAYS INTO EACH NOSTRIL EVERY DAY   indomethacin  (INDOCIN ) 50 MG capsule TAKE ONE CAPSULE BY MOUTH 3 TIMES A DAY WITH FOOD PRN GOUT FLARE **MAY STOP WHEN PAIN RESOLVED**   losartan  (COZAAR ) 25 MG tablet Take 1 tablet (25 mg total) by mouth daily.   Multiple Vitamin (MULTIVITAMIN) tablet Take 1 tablet by mouth daily.   No facility-administered medications prior to visit.    Review of Systems  Constitutional: Negative.  HENT:  Positive for nosebleeds. Negative for congestion and sinus pain.   Respiratory: Negative.  Negative for cough, sputum production and wheezing.   Cardiovascular: Negative.   Musculoskeletal:        Complains of pain over his left buttock that radiates to the top of his left thigh  Skin: Negative.   Neurological: Negative.   Psychiatric/Behavioral: Negative.            Objective:     BP 126/82   Pulse (!) 115   Temp 98 F (36.7 C)   Ht 5\' 11"  (1.803 m)   Wt 204 lb (92.5 kg)   SpO2 96%   BMI 28.45 kg/m    Physical Exam Constitutional:      Appearance: Normal appearance.  HENT:     Head: Normocephalic and atraumatic.     Nose:     Right Sinus: No maxillary sinus tenderness or  frontal sinus tenderness.     Left Sinus: No maxillary sinus tenderness or frontal sinus tenderness.     Comments: Dried blood noted in his right nostril Cardiovascular:     Rate and Rhythm: Normal rate and regular rhythm.     Heart sounds: Normal heart sounds.  Pulmonary:     Breath sounds: Normal breath sounds. No wheezing or rales.  Abdominal:     General: Bowel sounds are normal. There is no distension.     Palpations: Abdomen is soft.     Tenderness: There is no abdominal tenderness.  Musculoskeletal:        General: No swelling or tenderness.     Cervical back: Neck supple.  Lymphadenopathy:     Cervical: No cervical adenopathy.  Skin:    Comments: Noted to have firm nodules over the lateral side of his right knee as well as over the posterior aspect of right heel  Neurological:     Mental Status: He is alert and oriented to person, place, and time.  Psychiatric:        Mood and Affect: Mood normal.        Behavior: Behavior normal.      No results found for any visits on 02/29/24.     Assessment & Plan:    Routine Health Maintenance and Physical Exam  Immunization History  Administered Date(s) Administered   Hepatitis A, Adult 11/22/2019, 05/17/2020   Hepatitis B, ADULT 10/08/2017, 12/16/2017, 04/06/2018   Moderna Sars-Covid-2 Vaccination 03/20/2020, 04/17/2020   Tdap 08/05/2012    Health Maintenance  Topic Date Due   DTaP/Tdap/Td (2 - Td or Tdap) 08/05/2022   COVID-19 Vaccine (3 - Moderna risk series) 03/16/2024 (Originally 05/15/2020)   INFLUENZA VACCINE  04/29/2024   Hepatitis C Screening  Completed   HIV Screening  Completed   HPV VACCINES  Aged Out   Meningococcal B Vaccine  Aged Out    Discussed health benefits of physical activity, and encouraged him to engage in regular exercise appropriate for his age and condition.  Problem List Items Addressed This Visit       Cardiovascular and Mediastinum   Primary hypertension - Primary   - This problem  is chronic and stable -Patient had run out of his losartan  over the last month but had it refilled on Saturday.  He had not yet taken his antihypertensive today -His initial blood pressure when he came in was 134/84 with a repeat of 126/82 -Will continue with losartan  25 mg daily -Will check BMP and lipid today -No further workup at this  time      Relevant Orders   Comprehensive metabolic panel with GFR   Lipid panel     Digestive   Hepatic steatosis   - Patient has a history of hepatic steatosis with elevated liver enzymes (possibly secondary to allopurinol  use) -His weight has been stable and he denies any abdominal pain or jaundice -Will recheck a CMP today -Patient encouraged to continue healthy diet and exercise -No further workup at this time      Relevant Orders   Comprehensive metabolic panel with GFR     Nervous and Auditory   Piriformis syndrome   - Patient states that he sits for long periods of time 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 -He does carry a wallet in his back pocket but takes it out when he sits down -Pain begins over left buttock and radiates to left upper thigh -Symptoms are consistent with piriformis syndrome -Stretches were given to the patient to help with this -No further workup at this time        Other   Annual physical exam   - Patient physical exam was done today -Will do lab work as outlined -He is due for Tdap which we will address at his next visit -No further workup at this time      Epistaxis   - Patient complains of significant epistaxis occurring daily usually when he blows his nose -States that he notices mainly from his  right nostril -Does states that he has a history of allergic rhinitis and Flonase  has been helping with this -On exam, dried blood noted in his right nostril but no visible active bleeding currently noted -Given the patient has had daily symptoms of epistaxis will refer to ENT for further evaluation to  determine etiology -Will check CBC today -Continue with Flonase  -No further workup at this time      Relevant Orders   Ambulatory referral to ENT   Fatigue   - Patient states that he has had increased fatigue over the last few months and is concerned this is secondary to low testosterone levels -He denies any issues with maintaining or having erections or decreased libido -Etiology behind his fatigue remains uncertain at this time.  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 -Will check uric acid today -Will refer patient to rheumatology for further evaluation and management -No further workup at this time      Relevant Orders   Uric acid   Ambulatory referral to Rheumatology   Prediabetes   - This problem is chronic and stable -Will recheck an A1c today -No further workup at this time      Relevant Orders   Hemoglobin A1c   Vitamin D  deficiency   - Patient noted to have low vitamin D  levels over the last few years consistent with vitamin D  deficiency -States that he is not taking any supplements for this -  Will recheck vitamin D  level today and supplement based on current level -No further workup at this time      Relevant Orders   VITAMIN D  25 Hydroxy (Vit-D Deficiency, Fractures)   No follow-ups on file.     James Partington, MD

## 2024-02-29 NOTE — Assessment & Plan Note (Signed)
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

## 2024-02-29 NOTE — Addendum Note (Signed)
 Addended by: Reynolds Cea A on: 02/29/2024 03:32 PM   Modules accepted: Orders

## 2024-02-29 NOTE — Assessment & Plan Note (Signed)
-   This problem is chronic and stable -Will recheck an A1c today -No further workup at this time

## 2024-02-29 NOTE — Assessment & Plan Note (Signed)
-   Patient has a history of hepatic steatosis with elevated liver enzymes (possibly secondary to allopurinol  use) -His weight has been stable and he denies any abdominal pain or jaundice -Will recheck a CMP today -Patient encouraged to continue healthy diet and exercise -No further workup at this time

## 2024-02-29 NOTE — Assessment & Plan Note (Signed)
-   This problem is chronic and stable -Patient had run out of his losartan  over the last month but had it refilled on Saturday.  He had not yet taken his antihypertensive today -His initial blood pressure when he came in was 134/84 with a repeat of 126/82 -Will continue with losartan  25 mg daily -Will check BMP and lipid today -No further workup at this time

## 2024-02-29 NOTE — Assessment & Plan Note (Signed)
-   Patient complains of significant epistaxis occurring daily usually when he blows his nose -States that he notices mainly from his  right nostril -Does states that he has a history of allergic rhinitis and Flonase  has been helping with this -On exam, dried blood noted in his right nostril but no visible active bleeding currently noted -Given the patient has had daily symptoms of epistaxis will refer to ENT for further evaluation to determine etiology -Will check CBC today -Continue with Flonase  -No further workup at this time

## 2024-02-29 NOTE — Assessment & Plan Note (Signed)
-   Patient states that he has had increased fatigue over the last few months and is concerned this is secondary to low testosterone levels -He denies any issues with maintaining or having erections or decreased libido -Etiology behind his fatigue remains uncertain at this time.  Will check a CBC, TSH as well as testosterone levels -No further workup at this time

## 2024-02-29 NOTE — Assessment & Plan Note (Signed)
-   Patient physical exam was done today -Will do lab work as outlined -He is due for Tdap which we will address at his next visit -No further workup at this time

## 2024-02-29 NOTE — Patient Instructions (Addendum)
-   It was a pleasure meeting you today -We will check some blood work on you including your blood counts, testosterone, thyroid  function, uric acid, vitamin D  levels, liver/kidney function as well as your A1c and cholesterol levels -I have put in referral to rheumatology for your gout.  We will not start allopurinol  at this time given that it did increase your liver enzyme the last time you are on this. -Given that you have had bleeding from your nose for the last month when you blow your nose I have put in a referral to the ENT doctor to evaluate you further -I suspect that your left buttocks is likely secondary to piriformis syndrome and I put in some stretches for you to try at home -Please contact us  with any questions or concerns

## 2024-02-29 NOTE — Assessment & Plan Note (Signed)
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

## 2024-02-29 NOTE — Assessment & Plan Note (Signed)
-   Patient noted to have low vitamin D  levels over the last few years consistent with vitamin D  deficiency -States that he is not taking any supplements for this -Will recheck vitamin D  level today and supplement based on current level -No further workup at this time

## 2024-03-01 DIAGNOSIS — R000 Tachycardia, unspecified: Secondary | ICD-10-CM | POA: Insufficient documentation

## 2024-03-01 NOTE — Assessment & Plan Note (Signed)
-   Patient noted to be tachycardic on this office visit at 115 bpm -On reviewing his flowsheets he has been slightly tachycardic over his last few visits with heart rates ranging from 95-115 -Patient was asked to come back to the office to obtain an EKG -EKG reveals sinus tachycardia with no PVCs or APCs -TSH was within normal limits on this visit and hemoglobin was normal as well - No further workup at this time -If patient remains persistently tachycardic would consider further cardiac workup and possible referral to cardiology

## 2024-03-02 MED ORDER — VITAMIN D3 50 MCG (2000 UT) PO CAPS: 2000.0000 [IU] | ORAL_CAPSULE | Freq: Every day | ORAL | 1 refills | Status: DC

## 2024-03-02 NOTE — Addendum Note (Signed)
 Addended by: Kamy Poinsett on: 03/02/2024 03:20 PM   Modules accepted: Orders

## 2024-03-10 DIAGNOSIS — J342 Deviated nasal septum: Secondary | ICD-10-CM | POA: Diagnosis not present

## 2024-03-10 DIAGNOSIS — J310 Chronic rhinitis: Secondary | ICD-10-CM | POA: Diagnosis not present

## 2024-03-10 DIAGNOSIS — J3489 Other specified disorders of nose and nasal sinuses: Secondary | ICD-10-CM | POA: Diagnosis not present

## 2024-03-10 DIAGNOSIS — R040 Epistaxis: Secondary | ICD-10-CM | POA: Diagnosis not present

## 2024-05-03 DIAGNOSIS — M1A09X1 Idiopathic chronic gout, multiple sites, with tophus (tophi): Secondary | ICD-10-CM | POA: Diagnosis not present

## 2024-05-03 DIAGNOSIS — K760 Fatty (change of) liver, not elsewhere classified: Secondary | ICD-10-CM | POA: Diagnosis not present

## 2024-05-03 DIAGNOSIS — F1090 Alcohol use, unspecified, uncomplicated: Secondary | ICD-10-CM | POA: Diagnosis not present

## 2024-05-16 DIAGNOSIS — M25561 Pain in right knee: Secondary | ICD-10-CM | POA: Diagnosis not present

## 2024-05-16 DIAGNOSIS — Z8739 Personal history of other diseases of the musculoskeletal system and connective tissue: Secondary | ICD-10-CM | POA: Diagnosis not present

## 2024-05-16 DIAGNOSIS — M25461 Effusion, right knee: Secondary | ICD-10-CM | POA: Diagnosis not present

## 2024-05-25 DIAGNOSIS — R7989 Other specified abnormal findings of blood chemistry: Secondary | ICD-10-CM | POA: Diagnosis not present

## 2024-05-30 ENCOUNTER — Other Ambulatory Visit: Payer: Self-pay | Admitting: Nurse Practitioner

## 2024-05-30 DIAGNOSIS — G8929 Other chronic pain: Secondary | ICD-10-CM

## 2024-05-30 DIAGNOSIS — M1A9XX0 Chronic gout, unspecified, without tophus (tophi): Secondary | ICD-10-CM

## 2024-06-10 ENCOUNTER — Ambulatory Visit: Admitting: Nurse Practitioner

## 2024-06-10 VITALS — BP 130/98 | HR 98 | Temp 98.5°F | Ht 71.0 in | Wt 202.6 lb

## 2024-06-10 DIAGNOSIS — M109 Gout, unspecified: Secondary | ICD-10-CM | POA: Diagnosis not present

## 2024-06-10 DIAGNOSIS — G8929 Other chronic pain: Secondary | ICD-10-CM

## 2024-06-10 DIAGNOSIS — R748 Abnormal levels of other serum enzymes: Secondary | ICD-10-CM

## 2024-06-10 DIAGNOSIS — M545 Low back pain, unspecified: Secondary | ICD-10-CM | POA: Diagnosis not present

## 2024-06-10 DIAGNOSIS — I10 Essential (primary) hypertension: Secondary | ICD-10-CM | POA: Diagnosis not present

## 2024-06-10 DIAGNOSIS — E559 Vitamin D deficiency, unspecified: Secondary | ICD-10-CM

## 2024-06-10 DIAGNOSIS — R5383 Other fatigue: Secondary | ICD-10-CM

## 2024-06-10 DIAGNOSIS — J309 Allergic rhinitis, unspecified: Secondary | ICD-10-CM

## 2024-06-10 MED ORDER — FLUTICASONE PROPIONATE 50 MCG/ACT NA SUSP: 2.0000 | Freq: Every day | NASAL | 5 refills | Status: AC

## 2024-06-10 MED ORDER — INDOMETHACIN 50 MG PO CAPS: ORAL_CAPSULE | ORAL | 3 refills | Status: AC

## 2024-06-10 MED ORDER — CYCLOBENZAPRINE HCL 10 MG PO TABS: ORAL_TABLET | ORAL | 3 refills | Status: AC

## 2024-06-10 MED ORDER — LOSARTAN POTASSIUM 25 MG PO TABS: 25.0000 mg | ORAL_TABLET | Freq: Every day | ORAL | 3 refills | Status: AC

## 2024-06-10 MED ORDER — VITAMIN D3 50 MCG (2000 UT) PO CAPS: 2000.0000 [IU] | ORAL_CAPSULE | Freq: Every day | ORAL | 3 refills | Status: AC

## 2024-06-10 NOTE — Progress Notes (Signed)
 Leron Glance, NP-C Phone: [phone]  James Andrews is a 43 y.o. male who presents today for follow up.   Discussed the use of AI scribe software for clinical note transcription with the patient, who gave verbal consent to proceed.  History of Present Illness   James Andrews is a 43 year old male with gout and hypertension who presents for follow-up on elevated liver enzymes and gout management.  His liver enzymes were initially elevated. He reports increased alcohol consumption following a personal loss. Subsequent tests showed a substantial decrease after he reduced his alcohol intake. He is scheduled for another liver enzyme test in two weeks.  He has a history of gout, which has been problematic, leading to a referral to rheumatology. He cannot take allopurinol  due to elevated kidney levels experienced in the past, similar to his father's experience. He is currently on indomethacin  for acute gout attacks. He reports improvement in gout symptoms but mentions having his knee drained twice, with the most recent drainage yielding 17 milliliters of fluid, down from 77 milliliters previously.  He also reports a cyst-like formation on his knee, which appears and disappears, and small bumps on his Achilles tendon. He experiences intermittent leg and buttock pain, which improves with stretching and chiropractic visits.  He is on losartan  for hypertension. No chest pain, shortness of breath, dizziness, or leg swelling. He occasionally checks his blood pressure at home using a cuff and wrist monitor.  He mentions a family history of similar reactions to gout medications in his father and brother. His mother also has hypertension and is on medication.  He is currently taking losartan , indomethacin , Flonase , vitamin D , and a multivitamin. He requests refills for these medications and muscle relaxers.      Social History   Tobacco Use  Smoking Status Former  Smokeless Tobacco Former   Types: Chew    Quit date: 06/25/2017    Current Outpatient Medications on File Prior to Visit  Medication Sig Dispense Refill   Black Pepper-Turmeric (TURMERIC COMPLEX/BLACK PEPPER PO) Take by mouth.     Multiple Vitamin (MULTIVITAMIN) tablet Take 1 tablet by mouth daily.     No current facility-administered medications on file prior to visit.     ROS see history of present illness  Objective  Physical Exam Vitals:   06/10/24 1553 06/10/24 1618  BP: (!) 136/90 (!) 130/98  Pulse: 98   Temp: 98.5 F (36.9 C)   SpO2: 97%     BP Readings from Last 3 Encounters:  06/10/24 (!) 130/98  02/29/24 126/82  12/18/22 134/80   Wt Readings from Last 3 Encounters:  06/10/24 202 lb 9.6 oz (91.9 kg)  02/29/24 204 lb (92.5 kg)  12/04/22 201 lb 3.2 oz (91.3 kg)    Physical Exam Constitutional:      General: He is not in acute distress.    Appearance: Normal appearance.  HENT:     Head: Normocephalic.  Cardiovascular:     Rate and Rhythm: Normal rate and regular rhythm.     Heart sounds: Normal heart sounds.  Pulmonary:     Effort: Pulmonary effort is normal.     Breath sounds: Normal breath sounds.  Skin:    General: Skin is warm and dry.  Neurological:     General: No focal deficit present.     Mental Status: He is alert.  Psychiatric:        Mood and Affect: Mood normal.        Behavior: Behavior normal.  Assessment/Plan: Please see individual problem list.  Primary hypertension Assessment & Plan: Hypertension is managed with losartan , and blood pressure is elevated today in office x 2 readings. He will continue losartan  25 mg daily and monitor his blood pressure regularly at home. Advised to contact if remaining consistently elevated, consider increasing losartan  to 50 mg daily.   Orders: -     CBC with Differential/Platelet -     Losartan  Potassium; Take 1 tablet (25 mg total) by mouth daily.  Dispense: 90 tablet; Refill: 3  Elevated liver enzymes Assessment &  Plan: Elevated liver enzymes are likely due to alcohol consumption. Although enzyme levels have decreased, he is not yet at desired levels. Further monitoring is scheduled with rheumatology. Follow up with rheumatology for a recheck of liver enzymes and advise moderation in alcohol consumption.   Gout, unspecified cause, unspecified chronicity, unspecified site Assessment & Plan: Recurrent knee effusion is likely related to gout and is managed by rheumatology. Dietary modifications are advised. Follow up with rheumatology for ongoing management of gout-related knee effusion. Indomethacin  refilled today.   Orders: -     Indomethacin ; TAKE ONE CAPSULE BY MOUTH 3 TIMES A DAY WITH FOOD AS NEEDED GOUT FLARE **MAY STOP WHEN PAIN RESOLVED**  Dispense: 90 capsule; Refill: 3  Chronic midline low back pain without sciatica Assessment & Plan: Intermittent pain improves with stretching, chiropractic care and flexeril  as needed. Pain is manageable with current interventions. Continue with prescribed stretches, flexeril  daily as needed and encourage regular chiropractic visits.   Orders: -     Cyclobenzaprine  HCl; Take 1 tablet by mouth daily as needed for muscle spasms.  Dispense: 90 tablet; Refill: 3  Fatigue, unspecified type -     Testosterone ,Free and Total  Vitamin D  deficiency -     Vitamin D3; Take 1 capsule (2,000 Units total) by mouth daily.  Dispense: 90 capsule; Refill: 3  Allergic rhinitis, unspecified seasonality, unspecified trigger -     Fluticasone  Propionate; Place 2 sprays into both nostrils daily.  Dispense: 15.8 mL; Refill: 5      Return in about 6 months (around 12/08/2024) for Follow up.   Leron Glance, NP-C [facility]

## 2024-06-14 DIAGNOSIS — Z79899 Other long term (current) drug therapy: Secondary | ICD-10-CM | POA: Diagnosis not present

## 2024-06-14 NOTE — Addendum Note (Signed)
 Addended by: Alysa Duca on: 06/14/2024 11:10 AM   Modules accepted: Orders

## 2024-06-17 LAB — TESTOSTERONE,FREE AND TOTAL
Testosterone, Free: 8.6 pg/mL (ref 6.8–21.5)
Testosterone: 161 ng/dL — ABNORMAL LOW (ref 264–916)

## 2024-06-17 LAB — CBC WITH DIFFERENTIAL/PLATELET
Basophils Absolute: 0 x10E3/uL (ref 0.0–0.2)
Basos: 1 %
EOS (ABSOLUTE): 0.3 x10E3/uL (ref 0.0–0.4)
Eos: 8 %
Hematocrit: 41.5 % (ref 37.5–51.0)
Hemoglobin: 13.7 g/dL (ref 13.0–17.7)
Immature Grans (Abs): 0 x10E3/uL (ref 0.0–0.1)
Immature Granulocytes: 0 %
Lymphocytes Absolute: 1.1 x10E3/uL (ref 0.7–3.1)
Lymphs: 25 %
MCH: 31.2 pg (ref 26.6–33.0)
MCHC: 33 g/dL (ref 31.5–35.7)
MCV: 95 fL (ref 79–97)
Monocytes Absolute: 0.6 x10E3/uL (ref 0.1–0.9)
Monocytes: 13 %
Neutrophils Absolute: 2.4 x10E3/uL (ref 1.4–7.0)
Neutrophils: 53 %
Platelets: 204 x10E3/uL (ref 150–450)
RBC: 4.39 x10E6/uL (ref 4.14–5.80)
RDW: 13.6 % (ref 11.6–15.4)
WBC: 4.4 x10E3/uL (ref 3.4–10.8)

## 2024-06-22 ENCOUNTER — Ambulatory Visit: Payer: Self-pay | Admitting: Nurse Practitioner

## 2024-06-28 ENCOUNTER — Encounter: Payer: Self-pay | Admitting: Nurse Practitioner

## 2024-06-28 NOTE — Assessment & Plan Note (Signed)
 Hypertension is managed with losartan , and blood pressure is elevated today in office x 2 readings. He will continue losartan  25 mg daily and monitor his blood pressure regularly at home. Advised to contact if remaining consistently elevated, consider increasing losartan  to 50 mg daily.

## 2024-06-28 NOTE — Assessment & Plan Note (Signed)
 Intermittent pain improves with stretching, chiropractic care and flexeril  as needed. Pain is manageable with current interventions. Continue with prescribed stretches, flexeril  daily as needed and encourage regular chiropractic visits.

## 2024-06-28 NOTE — Assessment & Plan Note (Signed)
 Elevated liver enzymes are likely due to alcohol consumption. Although enzyme levels have decreased, he is not yet at desired levels. Further monitoring is scheduled with rheumatology. Follow up with rheumatology for a recheck of liver enzymes and advise moderation in alcohol consumption.

## 2024-06-28 NOTE — Assessment & Plan Note (Signed)
 Recurrent knee effusion is likely related to gout and is managed by rheumatology. Dietary modifications are advised. Follow up with rheumatology for ongoing management of gout-related knee effusion. Indomethacin  refilled today.

## 2024-07-21 DIAGNOSIS — E349 Endocrine disorder, unspecified: Secondary | ICD-10-CM | POA: Insufficient documentation

## 2024-07-21 NOTE — Progress Notes (Signed)
 07/28/24 9:58 AM   James Andrews [birth year] [id number]   HPI: 43 y.o. 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    Surgical History: Past Surgical History:  Procedure Laterality Date   FRACTURE SURGERY     Hand fracture    Family History: Family History  Problem Relation Age of Onset   Testicular cancer Brother        dx age 35   Cancer Brother        testicular   Arthritis Maternal Grandmother    Breast cancer Maternal Grandmother    Arthritis Paternal Grandfather    Diabetes Paternal Grandfather    Non-Hodgkin's lymphoma Paternal Aunt        from round up     Social History:  reports that he has quit smoking. He quit smokeless tobacco use about 7 years ago.  His smokeless tobacco use included chew. He reports that he does not drink alcohol and does not use drugs.      Physical Exam: BP (!) 157/114   Ht 5' 11 (1.803 m)   Wt 209 lb (94.8 kg)   BMI 29.15 kg/m    Constitutional:  Alert and oriented, No acute distress. Cardiovascular: No clubbing, cyanosis, or edema. Respiratory: Normal respiratory effort, no increased work of breathing. GI:  Nondistended Skin: No rashes, bruises or suspicious lesions. Neurologic: Grossly intact, no focal deficits, moving all 4 extremities. Psychiatric: Normal mood and affect.  Laboratory Data: Total T (06/10/24) - 161   Pertinent Imaging: N/A    Assessment & Plan:    Testosterone  deficiency Assessment & Plan: Total T (06/10/24) - 161 Low libido, daytime fatigue  Discussed the basic tenets of low testosterone  or male testosterone  deficiency, including proper diagnosis with clinical signs and objectively low total T <300 on two separate measurements.  Reviewed conservative strategies first -- optimization of weight, exercise, nutrition, sleep, and limiting EtOH/opioids. Discussed risks/benefits of TRT including infertility, erythrocytosis, CV/thrombotic risk, and prostate effects. Reviewed initial lab work with baseline Hgb/Hct, LFTs, and PSA, as well as serial draws for periodic monitoring.  Reviewed various TRT formulations including topical gels, IM injections, implantable pellets, and long-acting IM Aveed. Also, reviewed off-lab use of Clomid- an oral estrogen receptor modulator that can boost endogenous testosterone  production and avoid negative effects on spermiogenesis.  Shared decision-making re: trial of TRT vs continued conservative management.    - confirmatory testosterone  lab draw, LH/FSH, estradiol  - Reviewed TRT formulations + off-label Clomid -He may also benefit from lifestyle modifications, aerobic exercise regimen, improvement of sleep parameters-May boost endogenous testosterone  -RTC in 2-3 weeks for lab review and treatment decision  Orders: -     Testosterone ; Future -     FSH/LH; Future      Penne Skye, MD 07/28/2024  [facility] [address], [address] [place], [place] [address] [phone]

## 2024-07-21 NOTE — Assessment & Plan Note (Addendum)
 Total T (06/10/24) - 161 Low libido, daytime fatigue  Discussed the basic tenets of low testosterone  or male testosterone  deficiency, including proper diagnosis with clinical signs and objectively low total T <300 on two separate measurements.  Reviewed conservative strategies first -- optimization of weight, exercise, nutrition, sleep, and limiting EtOH/opioids. Discussed risks/benefits of TRT including infertility, erythrocytosis, CV/thrombotic risk, and prostate effects. Reviewed initial lab work with baseline Hgb/Hct, LFTs, and PSA, as well as serial draws for periodic monitoring.  Reviewed various TRT formulations including topical gels, IM injections, implantable pellets, and long-acting IM Aveed. Also, reviewed off-lab use of Clomid- an oral estrogen receptor modulator that can boost endogenous testosterone  production and avoid negative effects on spermiogenesis.  Shared decision-making re: trial of TRT vs continued conservative management.    - confirmatory testosterone  lab draw, LH/FSH, estradiol  - Reviewed TRT formulations + off-label Clomid -He may also benefit from lifestyle modifications, aerobic exercise regimen, improvement of sleep parameters-May boost endogenous testosterone  -RTC in 2-3 weeks for lab review and treatment decision

## 2024-07-28 ENCOUNTER — Ambulatory Visit: Admitting: Urology

## 2024-07-28 VITALS — BP 157/114 | Ht 71.0 in | Wt 209.0 lb

## 2024-07-28 DIAGNOSIS — E349 Endocrine disorder, unspecified: Secondary | ICD-10-CM | POA: Diagnosis not present

## 2024-08-09 ENCOUNTER — Other Ambulatory Visit

## 2024-08-09 DIAGNOSIS — E349 Endocrine disorder, unspecified: Secondary | ICD-10-CM | POA: Diagnosis not present

## 2024-08-10 LAB — TESTOSTERONE: Testosterone: 287 ng/dL (ref 264–916)

## 2024-08-10 LAB — FSH/LH
FSH: 8.2 m[IU]/mL (ref 1.5–12.4)
LH: 6.7 m[IU]/mL (ref 1.7–8.6)

## 2024-08-10 LAB — ESTRADIOL: Estradiol: 19.1 pg/mL (ref 7.6–42.6)

## 2024-08-23 ENCOUNTER — Telehealth: Payer: Self-pay | Admitting: Nurse Practitioner

## 2024-08-23 NOTE — Telephone Encounter (Signed)
 Lm and sent MyChart message: Your appointment time on December 09, 2024 has been changed from 3 pm to 3:20 pm do to a schedule change. If you are unable to make this time please call or reschedule your appointment through MyChart.  Thank you.

## 2024-08-27 ENCOUNTER — Telehealth: Payer: Self-pay | Admitting: Urology

## 2024-08-27 NOTE — Progress Notes (Signed)
 08/30/2024 9:57 PM   [name] [birth year] [id number]  Referring provider: Gretel App, NP [address]  [place] [address]  Urological history: 1.  No chief complaint on file.  HPI: James Andrews is a 43 y.o. man who presents today for discussion on low T.   Previous records reviewed.  He has been experiencing reduced energy, reduced endurance, diminished work performance, diminished physical performance, loss of body hair, reduced beard growth, fatigue, reduced lean muscle mass and obesity.  He has also noticed depressive symptoms, cognitive dysfunction, reduced motivation, poor concentration, poor memory and irritability.  There is also been a decreased in his libido and issues with erectile dysfunction.  PMH: Past Medical History:  Diagnosis Date   Chicken pox    COVID-19 virus detected    09/2019   Fatty liver    Gout    Seizures (HCC)    Absence seizure as a child.    Surgical History: Past Surgical History:  Procedure Laterality Date   FRACTURE SURGERY     Hand fracture    Home Medications:  Allergies as of 08/30/2024   No Known Allergies      Medication List        Accurate as of August 27, 2024  9:57 PM. If you have any questions, ask your nurse or doctor.          colchicine 0.6 MG tablet Take 0.6 mg by mouth.   cyclobenzaprine  10 MG tablet Commonly known as: FLEXERIL  Take 1 tablet by mouth daily as needed for muscle spasms.   fluticasone  50 MCG/ACT nasal spray Commonly known as: FLONASE  Place 2 sprays into both nostrils daily.   indomethacin  50 MG capsule Commonly known as: INDOCIN  TAKE ONE CAPSULE BY MOUTH 3 TIMES A DAY WITH FOOD AS NEEDED GOUT FLARE **MAY STOP WHEN PAIN RESOLVED**   losartan  25 MG tablet Commonly known as: COZAAR  Take 1 tablet (25 mg total) by mouth daily.   multivitamin tablet Take 1 tablet by mouth daily.   TURMERIC COMPLEX/BLACK PEPPER PO Take by mouth.   Vitamin D3 50 MCG (2000 UT)  capsule Take 1 capsule (2,000 Units total) by mouth daily.        Allergies: No Known Allergies  Family History: Family History  Problem Relation Age of Onset   Testicular cancer Brother        dx age 25   Cancer Brother        testicular   Arthritis Maternal Grandmother    Breast cancer Maternal Grandmother    Arthritis Paternal Grandfather    Diabetes Paternal Grandfather    Non-Hodgkin's lymphoma Paternal Aunt        from round up     Social History:  reports that he has quit smoking. He quit smokeless tobacco use about 7 years ago.  His smokeless tobacco use included chew. He reports that he does not drink alcohol and does not use drugs.  ROS: Pertinent ROS in HPI  Physical Exam: There were no vitals taken for this visit.  Constitutional:  Well nourished. Alert and oriented, No acute distress. HEENT: [place] AT, moist mucus membranes.  Trachea midline, no masses. Cardiovascular: No clubbing, cyanosis, or edema. Respiratory: Normal respiratory effort, no increased work of breathing. GI: Abdomen is soft, non tender, non distended, no abdominal masses. Liver and spleen not palpable.  No hernias appreciated.  Stool sample for occult testing is not indicated.   GU: No CVA tenderness.  No bladder  fullness or masses.  Patient with circumcised/uncircumcised phallus. ***Foreskin easily retracted***  Urethral meatus is patent.  No penile discharge. No penile lesions or rashes. Scrotum without lesions, cysts, rashes and/or edema.  Testicles are located scrotally bilaterally. No masses are appreciated in the testicles. Left and right epididymis are normal. Rectal: Patient with  normal sphincter tone. Anus and perineum without scarring or rashes. No rectal masses are appreciated. Prostate is approximately *** grams, *** nodules are appreciated. Seminal vesicles are normal. Skin: No rashes, bruises or suspicious lesions. Lymph: No cervical or inguinal adenopathy. Neurologic: Grossly intact,  no focal deficits, moving all 4 extremities. Psychiatric: Normal mood and affect.  Laboratory Data: See Epic and HPI   I have reviewed the labs.   Pertinent Imaging: N/A  Assessment & Plan:  ***  1. Hypogonadism  - explained that the diagnosis of testosterone  deficiency/hypogonadism requires two morning testosterones at least two days apart below 300 to meet criteria, he only has one - explained that the testosterone  drawn two months ago was an afternoon draw and his free testosterone  was normal - schedule second am testosterone  before 10 am          No follow-ups on file.  These notes generated with voice recognition software. I apologize for typographical errors.  [name]  [facility] [address]  [address] [place], [place] [address] [phone]

## 2024-08-27 NOTE — Telephone Encounter (Signed)
 James Andrews has an appointment with me on Tuesday and he needs one more testosterone  drawn before 10 am.  His first testosterone  was drawn in the afternoon and does not qualify.  If he can come earlier on Tuesday for labs and then I will see him at 10:20 or we can certainly do them later in the week

## 2024-08-30 ENCOUNTER — Ambulatory Visit: Admitting: Urology

## 2024-08-30 ENCOUNTER — Encounter: Payer: Self-pay | Admitting: Urology

## 2024-08-30 VITALS — BP 156/98 | HR 120 | Ht 71.0 in | Wt 205.0 lb

## 2024-08-30 DIAGNOSIS — I10 Essential (primary) hypertension: Secondary | ICD-10-CM | POA: Diagnosis not present

## 2024-08-30 DIAGNOSIS — E349 Endocrine disorder, unspecified: Secondary | ICD-10-CM

## 2024-09-06 ENCOUNTER — Other Ambulatory Visit

## 2024-09-08 ENCOUNTER — Other Ambulatory Visit

## 2024-09-08 DIAGNOSIS — E349 Endocrine disorder, unspecified: Secondary | ICD-10-CM

## 2024-09-09 ENCOUNTER — Ambulatory Visit: Payer: Self-pay | Admitting: Urology

## 2024-09-09 LAB — TESTOSTERONE: Testosterone: 305 ng/dL (ref 264–916)

## 2024-10-14 ENCOUNTER — Other Ambulatory Visit: Payer: Self-pay | Admitting: Rheumatology

## 2024-10-14 DIAGNOSIS — M1A09X1 Idiopathic chronic gout, multiple sites, with tophus (tophi): Secondary | ICD-10-CM

## 2024-10-14 DIAGNOSIS — R229 Localized swelling, mass and lump, unspecified: Secondary | ICD-10-CM

## 2024-10-18 ENCOUNTER — Encounter: Payer: Self-pay | Admitting: Rheumatology

## 2024-10-21 ENCOUNTER — Ambulatory Visit
Admission: RE | Admit: 2024-10-21 | Discharge: 2024-10-21 | Disposition: A | Source: Ambulatory Visit | Attending: Rheumatology | Admitting: Rheumatology

## 2024-10-21 DIAGNOSIS — R229 Localized swelling, mass and lump, unspecified: Secondary | ICD-10-CM

## 2024-10-21 DIAGNOSIS — M1A09X1 Idiopathic chronic gout, multiple sites, with tophus (tophi): Secondary | ICD-10-CM

## 2024-12-09 ENCOUNTER — Ambulatory Visit: Admitting: Nurse Practitioner
